# Patient Record
Sex: Female | Born: 1955 | Race: White | Hispanic: No | Smoking: Former smoker
Health system: Southern US, Community
[De-identification: ages and names within clinical notes are randomized; demographics above are authoritative.]

## PROBLEM LIST (undated history)

## (undated) DIAGNOSIS — B009 Herpesviral infection, unspecified: Secondary | ICD-10-CM

## (undated) DIAGNOSIS — E049 Nontoxic goiter, unspecified: Secondary | ICD-10-CM

## (undated) DIAGNOSIS — R112 Nausea with vomiting, unspecified: Secondary | ICD-10-CM

## (undated) DIAGNOSIS — K219 Gastro-esophageal reflux disease without esophagitis: Secondary | ICD-10-CM

## (undated) DIAGNOSIS — M069 Rheumatoid arthritis, unspecified: Secondary | ICD-10-CM

## (undated) DIAGNOSIS — E039 Hypothyroidism, unspecified: Secondary | ICD-10-CM

## (undated) DIAGNOSIS — M1990 Unspecified osteoarthritis, unspecified site: Secondary | ICD-10-CM

## (undated) DIAGNOSIS — N8501 Benign endometrial hyperplasia: Secondary | ICD-10-CM

## (undated) DIAGNOSIS — T8859XA Other complications of anesthesia, initial encounter: Secondary | ICD-10-CM

## (undated) DIAGNOSIS — M810 Age-related osteoporosis without current pathological fracture: Secondary | ICD-10-CM

## (undated) DIAGNOSIS — Z9889 Other specified postprocedural states: Secondary | ICD-10-CM

## (undated) HISTORY — PX: WISDOM TOOTH EXTRACTION: SHX21

## (undated) HISTORY — DX: Nontoxic goiter, unspecified: E04.9

## (undated) HISTORY — DX: Age-related osteoporosis without current pathological fracture: M81.0

## (undated) HISTORY — DX: Herpesviral infection, unspecified: B00.9

## (undated) HISTORY — DX: Rheumatoid arthritis, unspecified: M06.9

## (undated) HISTORY — PX: BREAST EXCISIONAL BIOPSY: SUR124

## (undated) HISTORY — DX: Benign endometrial hyperplasia: N85.01

## (undated) HISTORY — PX: DILATION AND CURETTAGE OF UTERUS: SHX78

## (undated) HISTORY — DX: Gastro-esophageal reflux disease without esophagitis: K21.9

---
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
## 1992-11-01 HISTORY — PX: TMJ ARTHROPLASTY: SHX1066

## 2001-09-01 HISTORY — PX: HYSTEROSCOPY: SHX211

## 2004-09-23 ENCOUNTER — Other Ambulatory Visit: Admission: RE | Admit: 2004-09-23 | Discharge: 2004-09-23 | Payer: Self-pay | Admitting: Gynecology

## 2004-11-01 HISTORY — PX: THYROIDECTOMY: SHX17

## 2004-11-16 ENCOUNTER — Ambulatory Visit (HOSPITAL_COMMUNITY): Admission: RE | Admit: 2004-11-16 | Discharge: 2004-11-16 | Payer: Self-pay | Admitting: Endocrinology

## 2005-01-05 ENCOUNTER — Observation Stay (HOSPITAL_COMMUNITY): Admission: RE | Admit: 2005-01-05 | Discharge: 2005-01-06 | Payer: Self-pay | Admitting: Surgery

## 2005-01-05 ENCOUNTER — Encounter (INDEPENDENT_AMBULATORY_CARE_PROVIDER_SITE_OTHER): Payer: Self-pay | Admitting: Specialist

## 2005-09-20 ENCOUNTER — Other Ambulatory Visit: Admission: RE | Admit: 2005-09-20 | Discharge: 2005-09-20 | Payer: Self-pay | Admitting: Gynecology

## 2006-09-26 ENCOUNTER — Other Ambulatory Visit: Admission: RE | Admit: 2006-09-26 | Discharge: 2006-09-26 | Payer: Self-pay | Admitting: Gynecology

## 2006-09-27 ENCOUNTER — Ambulatory Visit (HOSPITAL_COMMUNITY): Admission: RE | Admit: 2006-09-27 | Discharge: 2006-09-27 | Payer: Self-pay | Admitting: Gynecology

## 2006-10-14 ENCOUNTER — Encounter: Admission: RE | Admit: 2006-10-14 | Discharge: 2006-10-14 | Payer: Self-pay | Admitting: Gynecology

## 2007-04-05 ENCOUNTER — Encounter: Admission: RE | Admit: 2007-04-05 | Discharge: 2007-04-05 | Payer: Self-pay | Admitting: Obstetrics and Gynecology

## 2007-09-02 DIAGNOSIS — N8501 Benign endometrial hyperplasia: Secondary | ICD-10-CM

## 2007-09-02 HISTORY — DX: Benign endometrial hyperplasia: N85.01

## 2007-09-08 ENCOUNTER — Encounter: Payer: Self-pay | Admitting: Gynecology

## 2007-09-08 ENCOUNTER — Ambulatory Visit (HOSPITAL_BASED_OUTPATIENT_CLINIC_OR_DEPARTMENT_OTHER): Admission: RE | Admit: 2007-09-08 | Discharge: 2007-09-08 | Payer: Self-pay | Admitting: Gynecology

## 2007-11-13 ENCOUNTER — Other Ambulatory Visit: Admission: RE | Admit: 2007-11-13 | Discharge: 2007-11-13 | Payer: Self-pay | Admitting: Gynecology

## 2007-11-20 ENCOUNTER — Encounter: Admission: RE | Admit: 2007-11-20 | Discharge: 2007-11-20 | Payer: Self-pay | Admitting: Gynecology

## 2008-12-02 ENCOUNTER — Ambulatory Visit: Payer: Self-pay | Admitting: Gynecology

## 2008-12-02 ENCOUNTER — Encounter: Payer: Self-pay | Admitting: Gynecology

## 2008-12-02 ENCOUNTER — Other Ambulatory Visit: Admission: RE | Admit: 2008-12-02 | Discharge: 2008-12-02 | Payer: Self-pay | Admitting: Gynecology

## 2008-12-04 ENCOUNTER — Encounter: Admission: RE | Admit: 2008-12-04 | Discharge: 2008-12-04 | Payer: Self-pay | Admitting: Gynecology

## 2008-12-18 ENCOUNTER — Encounter: Payer: Self-pay | Admitting: Gynecology

## 2008-12-18 ENCOUNTER — Ambulatory Visit: Payer: Self-pay | Admitting: Gynecology

## 2009-12-03 ENCOUNTER — Ambulatory Visit: Payer: Self-pay | Admitting: Gynecology

## 2009-12-03 ENCOUNTER — Other Ambulatory Visit: Admission: RE | Admit: 2009-12-03 | Discharge: 2009-12-03 | Payer: Self-pay | Admitting: Gynecology

## 2009-12-12 ENCOUNTER — Encounter: Admission: RE | Admit: 2009-12-12 | Discharge: 2009-12-12 | Payer: Self-pay | Admitting: Gynecology

## 2011-01-05 ENCOUNTER — Other Ambulatory Visit: Payer: Self-pay | Admitting: Gynecology

## 2011-01-05 DIAGNOSIS — Z1231 Encounter for screening mammogram for malignant neoplasm of breast: Secondary | ICD-10-CM

## 2011-01-13 ENCOUNTER — Encounter (INDEPENDENT_AMBULATORY_CARE_PROVIDER_SITE_OTHER): Payer: 59 | Admitting: Gynecology

## 2011-01-13 ENCOUNTER — Ambulatory Visit
Admission: RE | Admit: 2011-01-13 | Discharge: 2011-01-13 | Disposition: A | Discharge status: Home / Self Care | Payer: 59 | Source: Ambulatory Visit | Attending: Gynecology | Admitting: Gynecology

## 2011-01-13 ENCOUNTER — Other Ambulatory Visit (HOSPITAL_COMMUNITY)
Admission: RE | Admit: 2011-01-13 | Discharge: 2011-01-13 | Disposition: A | Discharge status: Home / Self Care | Payer: 59 | Source: Ambulatory Visit | Attending: Gynecology | Admitting: Gynecology

## 2011-01-13 ENCOUNTER — Other Ambulatory Visit: Payer: Self-pay | Admitting: Gynecology

## 2011-01-13 DIAGNOSIS — Z1231 Encounter for screening mammogram for malignant neoplasm of breast: Secondary | ICD-10-CM

## 2011-01-13 DIAGNOSIS — Z01419 Encounter for gynecological examination (general) (routine) without abnormal findings: Secondary | ICD-10-CM

## 2011-01-13 DIAGNOSIS — E559 Vitamin D deficiency, unspecified: Secondary | ICD-10-CM

## 2011-01-13 DIAGNOSIS — Z124 Encounter for screening for malignant neoplasm of cervix: Secondary | ICD-10-CM | POA: Insufficient documentation

## 2011-03-16 NOTE — H&P (Signed)
Donna Padilla, Donna Padilla             ACCOUNT NO.:  [id number]   MEDICAL RECORD NO.:  [id number]          PATIENT TYPE:  AMB   LOCATION:  [facility]                         FACILITY:  [facility]   PHYSICIAN:  Timothy P. Fontaine, M.D.DATE OF BIRTH:  [birth year]   DATE OF ADMISSION:  DATE OF DISCHARGE:                              HISTORY & PHYSICAL   CHIEF COMPLAINT:  Irregular menses, menorrhagia, endometrial polyp.   HISTORY OF PRESENT ILLNESS:  A 55 year old G2, P2 female, vasectomy  birth control, initially presented with heavy periods and irregular  periods.  Ultrasound evaluation Sonohystogram revealed a large  endometrial polyp measuring 35 mm, and she is admitted at this time for  hysteroscopy D&C removal of the polyp.   PAST MEDICAL HISTORY:  Significant for hypothyroidism being followed by  Dr. Talmage Nap, currently on Synthroid.   PAST SURGICAL HISTORY:  1. Appendectomy.  2. Breast biopsy.  3. TMJ surgery.  4. Thyroidectomy.   CURRENT MEDICATIONS:  Synthroid.   ALLERGIES:  No medications.   REVIEW OF SYSTEMS:  Noncontributory.   FAMILY HISTORY:  Noncontributory.   SOCIAL HISTORY:  Noncontributory.   PHYSICAL EXAMINATION:  VITAL SIGNS:  Afebrile, vital signs stable.  HEENT: Normal.  LUNGS:  Clear.  CARDIAC:  Regular rate without rubs, murmurs or gallops.  ABDOMEN:  Benign.  PELVIC:  External BUS, vagina normal.  Cervix normal.  Uterus normal  size, midline mobile, nontender.  Adnexa without masses or tenderness.   ASSESSMENT:  A 55 year old G2 P2 female with heavy menses irregular  menses and ultrasound showing one, possibly two large endometrial  polyps.  The patient is admitted at this time for hysteroscopy, removal  of polyps, D&C.  I reviewed with the patient the proposed surgery,  expected intraoperative postoperative courses, instrumentation to  include use of hysteroscope, resectoscope and D&C.  The acute  intraoperative/postoperative risks include bleeding,  transfusion,  infection, uterine perforation either immediately recognized or delay  recognized,  damage to internal organs, bowel, bladder, ureters, vessels and nerves  necessitating major exploratory reparative surgeries, future reparative  surgeries, bowel resection, ostomy formation were all discussed,  understood and accepted.  The patient's questions were answered to her  satisfaction.  She is ready to proceed with surgery.      Timothy P. Fontaine, M.D.  Electronically Signed     TPF/MEDQ  D:  09/04/2007  T:  09/05/2007  Job:  [id number]

## 2011-03-16 NOTE — Op Note (Signed)
Donna Padilla, ORMAND             ACCOUNT NO.:  [id number]   MEDICAL RECORD NO.:  [id number]          PATIENT TYPE:  AMB   LOCATION:  [facility]                         FACILITY:  [facility]   PHYSICIAN:  Timothy P. Fontaine, M.D.DATE OF BIRTH:  08-18-1956   DATE OF PROCEDURE:  09/08/2007  DATE OF DISCHARGE:                               OPERATIVE REPORT   PREOPERATIVE DIAGNOSES:  Endometrial polyp, menorrhagia.   POSTOPERATIVE DIAGNOSES:  Endometrial polyp, menorrhagia.   PROCEDURE:  Hysteroscopic resection endometrial polyp, D&C.   SURGEON:  Dr. Colin Broach.   ANESTHETIC:  General with 1% lidocaine paracervical block.   SPECIMEN:  1. Endometrial polyps.  2. Endometrial curetting.   COMPLICATIONS:  None.   ESTIMATED BLOOD LOSS:  Minimal.   SORBITOL DISCREPANCY:  60 mL.   FINDINGS:  EUA external BUS and vagina normal.  Cervix normal, uterus  normal size, midline and mobile.  Adnexa without masses.  Hysteroscopic  large right fundal endometrial polyp from fundus down to the internal os  to smaller left fundal polyps post polypectomy.  Hysteroscopic  evaluation was adequate and normal.   DESCRIPTION OF PROCEDURE:  The patient was taken to the operating room,  underwent general anesthesia and was placed in the dorsal lithotomy  position, received a vaginal perineal preparation with Betadine  solution. Bladder emptied with in-and-out Foley catheterization.  EUA  performed and the patient was draped in the usual fashion.  The cervix  was visualized with a speculum, a single-tooth tenaculum applied to the  anterior lip. A 1% lidocaine paracervical block was placed, a total of  10 mL.  The cervix was gently and gradually dilated to admit the  operative hysteroscope.  Hysteroscopy was performed with findings noted  above.  The large right fundal polyp was transected at its base with a  right-angle resectoscopic loop, removed and sent to pathology intact.  The two smaller polyps were  similarly excised at their base with the  right-angle loop and sent to pathology.  A sharp curettage was  performed, sent to pathology as a separate specimen. Rehysteroscopy  showed an adequate normal-appearing cavity with good  distention, no evidence of perforation.  The instruments were all  removed.  Hemostasis visualized.  The speculum removed. The patient  placed in supine position, awakened without difficulty and taken to  recovery room in good condition having tolerated the procedure well.      Timothy P. Fontaine, M.D.  Electronically Signed     TPF/MEDQ  D:  09/08/2007  T:  09/09/2007  Job:  [id number]

## 2011-03-19 NOTE — Op Note (Signed)
[name], Padilla             ACCOUNT NO.:  [id number]   MEDICAL RECORD NO.:  [id number]          PATIENT TYPE:  AMB   LOCATION:  [facility]                          FACILITY:  [facility]   PHYSICIAN:  Velora Heckler, MD      DATE OF BIRTH:  Jun 15, 1956   DATE OF PROCEDURE:  01/05/2005  DATE OF DISCHARGE:                                 OPERATIVE REPORT   PREOPERATIVE DIAGNOSIS:  Thyroid goiter with compressive symptoms.   POSTOPERATIVE DIAGNOSES:  Thyroid goiter with compressive symptoms.   PROCEDURE:  Total thyroidectomy.   SURGEON:  Velora Heckler, M.D.   ASSISTANT:  Rose Phi. Maple Hudson, M.D.   ANESTHESIA:  General.   ESTIMATED BLOOD LOSS:  75 mL.   PREPARATION:  Betadine.   COMPLICATIONS:  None.   INDICATIONS:  The patient is a 55 year old white female from [place], [place]  who presents with thyroid goiter. This has been present for five years and  gradually increasing in size. The patient has been on thyroid hormone  suppression without success. She was referred to Dr. Dorisann Frames at [facility]  [facility]. A thyroid ultrasound showed a multinodular goiter with  dominant nodules in both lobes. The patient has developed progressive  compressive symptoms including hoarseness and solid food dysphagia. The  patient now comes to surgery for thyroidectomy.   DESCRIPTION OF PROCEDURE:  The procedure is done in OR #4 at the [facility]. The patient is brought to the operating room, placed in  a supine position on the operating room table. Following the administration  of general anesthesia,  the patient is prepped and draped in usual strict  aseptic fashion. After ascertaining that an adequate level of anesthesia had  been obtained, a Kocher incision was made with a  #15 blade. Dissection was  carried down through subcutaneous tissues and platysma. Hemostasis was  obtained with the electrocautery. Skin flaps were developed cephalad and  caudad from the thyroid  notch to the sternal notch. A Mahorner self-  retaining retractor was placed for exposure. The goiter is quite large. It  was relatively symmetric. Dissection was begun on the left side. Strap  muscles were reflected laterally. Left thyroid lobe was mobilized. Middle  thyroid vein was divided between medium ligaclips.  The superior pole was  gently dissected out. The superior pole vessels were ligated in continuity  with 2-0 silk ties and medium ligaclips and divided. The gland is rolled  further anteriorly. The inferior venous tributaries were divided between  medium ligaclips and also ligated with 2-0 silk ties. The gland is rolled  further to the midline. Branches of the inferior thyroid artery were  dissected out and divided between small and medium ligaclips. Recurrent  laryngeal nerve was identified and preserved. Parathyroid tissue was  identified and preserved. Dissection was carried up to the trachea. A small  pyramidal lobe was also excised with the electrocautery from the midline.  Next we turned our attention to the right lobe. Again strap muscles were  reflected laterally. Middle thyroid vein was divided between medium  ligaclips. The superior pole was  dissected out. The superior pole vessels  were ligated in continuity with 2-0 silk ties and medium ligaclips and  divided. The gland is rolled medially. Inferior venous tributaries were  divided between medium ligaclips. The recurrent nerve was identified and  preserved. Parathyroid tissue was identified and preserved. Branches of the  inferior thyroid artery adjacent to the trachea were divided between medium  hemoclips. The gland was rolled up and onto the anterior trachea from which  it was excised with the electrocautery. Two remaining inferior venous  tributaries were ligated with 2-0 silk ties and divided. The gland was  marked with a suture on the left lobe. It is submitted in its entirety to  pathology for review. Neck  was irrigated with warm saline. Good hemostasis  was achieved. Surgicel  was placed over the area of the recurrent nerves  bilaterally. Strap muscles were closed with interrupted 3-0 Vicryl sutures.  Platysma was closed with interrupted 3-0 Vicryl sutures. The skin was closed  with a running 4-0 Vicryl subcuticular suture. Wounds is washed and dried  and Benzoin and Steri-Strips were applied. Sterile dressings were applied.  The patient was awakened from anesthesia and brought to the recovery room in  stable condition. The patient tolerated the procedure well.      TMG/MEDQ  D:  01/05/2005  T:  01/05/2005  Job:  [id number]   cc:   Dorisann Frames, M.D.  [place] [address], [place] [address]  Fax: [phone]

## 2011-12-14 ENCOUNTER — Other Ambulatory Visit: Payer: Self-pay | Admitting: Gynecology

## 2011-12-14 DIAGNOSIS — Z1231 Encounter for screening mammogram for malignant neoplasm of breast: Secondary | ICD-10-CM

## 2012-01-05 DIAGNOSIS — B009 Herpesviral infection, unspecified: Secondary | ICD-10-CM | POA: Insufficient documentation

## 2012-01-05 DIAGNOSIS — N8501 Benign endometrial hyperplasia: Secondary | ICD-10-CM | POA: Insufficient documentation

## 2012-01-05 DIAGNOSIS — E049 Nontoxic goiter, unspecified: Secondary | ICD-10-CM | POA: Insufficient documentation

## 2012-01-18 ENCOUNTER — Encounter: Payer: Self-pay | Admitting: Gynecology

## 2012-01-18 ENCOUNTER — Ambulatory Visit (INDEPENDENT_AMBULATORY_CARE_PROVIDER_SITE_OTHER): Payer: 59 | Admitting: Gynecology

## 2012-01-18 VITALS — BP 128/78 | Ht 69.5 in | Wt 246.0 lb

## 2012-01-18 DIAGNOSIS — Z01419 Encounter for gynecological examination (general) (routine) without abnormal findings: Secondary | ICD-10-CM

## 2012-01-18 NOTE — Patient Instructions (Signed)
Follow up in one year for her annual gynecologic exam. 

## 2012-01-18 NOTE — Progress Notes (Signed)
Donna Padilla 1956-06-03 [id number]        56 y.o.  for annual exam.  Doing well without complaints.  Past medical history,surgical history, medications, allergies, family history and social history were all reviewed and documented in the EPIC chart. ROS:  Was performed and pertinent positives and negatives are included in the history.  Exam: Sherrilyn Rist chaperone present Filed Vitals:   01/18/12 1210  BP: 128/78   General appearance  Normal Skin grossly normal Head/Neck normal with no cervical or supraclavicular adenopathy thyroid normal Lungs  clear Cardiac RR, without RMG Abdominal  soft, nontender, without masses, organomegaly or hernia Breasts  examined lying and sitting without masses, retractions, discharge or axillary adenopathy. Pelvic  Ext/BUS/vagina  normal mild atrophic changes  Cervix  normal    Uterus  axial, normal size, shape and contour, midline and mobile nontender   Adnexa  Without masses or tenderness    Anus and perineum  normal   Rectovaginal  normal sphincter tone without palpated masses or tenderness.    Assessment/Plan:  56 y.o. female for annual exam.    1. Postmenopausal doing well, no bleeding or significant symptoms. 2. Pap smear. I did not do a Pap smear today. Her last Pap smear was 2012.  No history of abnormal Pap smears before with multiple normal reports in her chart.  I discussed current screening guidelines and she is comfortable with less frequent interval and we'll plan on every three-year Pap smears. 3. Mammography. Patient has her mammogram scheduled in 2 days. She'll continue with annual mammography. SBE monthly reviewed. 4. Colonoscopy. Last colonoscopy 2008. Due to be repeated at a 10 year interval. I gave her an OC light kit and she knows to mail this back, call make sure we received it and that it is negative. 5. Bone health. Patient had her DEXA done at Dr. Janus Molder office and it was normal 2 years ago.  Increased calcium vitamin D  reviewed. 6. Health maintenance. Her blood work was done today as it's all done through Dr. Willeen Cass office who follows her for her medical issues.    Dara Lords MD, 12:33 PM 01/18/2012

## 2012-01-20 ENCOUNTER — Ambulatory Visit
Admission: RE | Admit: 2012-01-20 | Discharge: 2012-01-20 | Disposition: A | Discharge status: Home / Self Care | Payer: 59 | Source: Ambulatory Visit | Attending: Gynecology | Admitting: Gynecology

## 2012-01-20 DIAGNOSIS — Z1231 Encounter for screening mammogram for malignant neoplasm of breast: Secondary | ICD-10-CM

## 2013-02-15 ENCOUNTER — Other Ambulatory Visit: Payer: Self-pay

## 2013-02-15 ENCOUNTER — Other Ambulatory Visit: Payer: Self-pay | Admitting: Gynecology

## 2013-02-15 DIAGNOSIS — Z1231 Encounter for screening mammogram for malignant neoplasm of breast: Secondary | ICD-10-CM

## 2013-03-02 ENCOUNTER — Encounter: Payer: Self-pay | Admitting: Gynecology

## 2013-03-14 ENCOUNTER — Encounter: Payer: Self-pay | Admitting: Gynecology

## 2013-03-14 ENCOUNTER — Ambulatory Visit (INDEPENDENT_AMBULATORY_CARE_PROVIDER_SITE_OTHER): Payer: 59 | Admitting: Gynecology

## 2013-03-14 VITALS — BP 120/78 | Ht 69.0 in | Wt 232.0 lb

## 2013-03-14 DIAGNOSIS — Z01419 Encounter for gynecological examination (general) (routine) without abnormal findings: Secondary | ICD-10-CM

## 2013-03-14 DIAGNOSIS — R21 Rash and other nonspecific skin eruption: Secondary | ICD-10-CM

## 2013-03-14 MED ORDER — NYSTATIN-TRIAMCINOLONE 100000-0.1 UNIT/GM-% EX OINT: TOPICAL_OINTMENT | Freq: Two times a day (BID) | CUTANEOUS | Status: DC

## 2013-03-14 NOTE — Progress Notes (Signed)
Donna Padilla [birth year] [id number]        57 y.o.  G2P2002 for annual exam.  Doing well without complaints.  Past medical history,surgical history, medications, allergies, family history and social history were all reviewed and documented in the EPIC chart. ROS:  Was performed and pertinent positives and negatives are included in the history.  Exam: Kim assistant Filed Vitals:   03/14/13 1447  BP: 120/78  Height: 5\' 9"  (1.753 m)  Weight: 232 lb (105.235 kg)   General appearance  Normal Skin grossly normal Head/Neck normal with no cervical or supraclavicular adenopathy thyroid normal Lungs  clear Cardiac RR, without RMG Abdominal  soft, nontender, without masses, organomegaly or hernia Breasts  examined lying and sitting without masses, retractions, discharge or axillary adenopathy. Pelvic  Ext/BUS/vagina  normal   Cervix  normal   Uterus  anteverted, normal size, shape and contour, midline and mobile nontender   Adnexa  Without masses or tenderness    Anus and perineum  normal   Rectovaginal  normal sphincter tone without palpated masses or tenderness.    Assessment/Plan:  [name] female for annual exam.   1. Postmenopausal. Doing well without significant complaints of hot flashes sweats vaginal dryness or irritation. We'll continue to monitor. 2. Urinary frequency. Patient noted some urinary frequency after drinking carbonated beverages. We'll go ahead and check a urinalysis just to make sure she does not have a low-grade UTI. Otherwise recommend avoid carbonated beverages. 3. Mammography scheduled next week. Continue with annual mammography. SBE monthly reviewed. 4. Pap smear 2012. No Pap smear done today. No history of abnormal Pap smears previously. Plan repeat next year at three-year interval. 5. DEXA 2012 through primary physician's office reportedly normal. We'll continue to followup with them in reference to this. Increase calcium vitamin D  reviewed. 6. Colonoscopy 8 years ago with planned repeat at 10 year interval. 7. Health maintenance. No lab work done as it is all done through her primary physician's office. Followup one year, sooner as needed.    Dara Lords MD, 3:25 PM 03/14/2013

## 2013-03-14 NOTE — Patient Instructions (Signed)
Follow-up in 1 year for annual exam, sooner if any issues. 

## 2013-03-15 LAB — URINALYSIS W MICROSCOPIC + REFLEX CULTURE
Bacteria, UA: NONE SEEN
Bilirubin Urine: NEGATIVE
Casts: NONE SEEN
Crystals: NONE SEEN
Glucose, UA: NEGATIVE mg/dL
Hgb urine dipstick: NEGATIVE
Ketones, ur: NEGATIVE mg/dL
Leukocytes, UA: NEGATIVE
Nitrite: NEGATIVE
Protein, ur: NEGATIVE mg/dL
Specific Gravity, Urine: 1.012 (ref 1.005–1.030)
Squamous Epithelial / LPF: NONE SEEN
Urobilinogen, UA: 0.2 mg/dL (ref 0.0–1.0)
pH: 5.5 (ref 5.0–8.0)

## 2013-03-19 ENCOUNTER — Ambulatory Visit
Admission: RE | Admit: 2013-03-19 | Discharge: 2013-03-19 | Disposition: A | Discharge status: Home / Self Care | Payer: 59 | Source: Ambulatory Visit

## 2013-03-19 DIAGNOSIS — Z1231 Encounter for screening mammogram for malignant neoplasm of breast: Secondary | ICD-10-CM

## 2014-03-08 ENCOUNTER — Other Ambulatory Visit: Payer: Self-pay

## 2014-03-08 DIAGNOSIS — Z1231 Encounter for screening mammogram for malignant neoplasm of breast: Secondary | ICD-10-CM

## 2014-03-15 ENCOUNTER — Encounter: Payer: 59 | Admitting: Gynecology

## 2014-03-26 ENCOUNTER — Ambulatory Visit
Admission: RE | Admit: 2014-03-26 | Discharge: 2014-03-26 | Disposition: A | Discharge status: Home / Self Care | Payer: 59 | Source: Ambulatory Visit

## 2014-03-26 DIAGNOSIS — Z1231 Encounter for screening mammogram for malignant neoplasm of breast: Secondary | ICD-10-CM

## 2014-04-25 ENCOUNTER — Other Ambulatory Visit (HOSPITAL_COMMUNITY)
Admission: RE | Admit: 2014-04-25 | Discharge: 2014-04-25 | Disposition: A | Discharge status: Home / Self Care | Payer: 59 | Source: Ambulatory Visit | Attending: Gynecology | Admitting: Gynecology

## 2014-04-25 ENCOUNTER — Encounter: Payer: Self-pay | Admitting: Gynecology

## 2014-04-25 ENCOUNTER — Ambulatory Visit (INDEPENDENT_AMBULATORY_CARE_PROVIDER_SITE_OTHER): Payer: 59 | Admitting: Gynecology

## 2014-04-25 VITALS — BP 134/80 | Ht 69.5 in | Wt 232.0 lb

## 2014-04-25 DIAGNOSIS — B009 Herpesviral infection, unspecified: Secondary | ICD-10-CM

## 2014-04-25 DIAGNOSIS — Z01419 Encounter for gynecological examination (general) (routine) without abnormal findings: Secondary | ICD-10-CM

## 2014-04-25 DIAGNOSIS — E559 Vitamin D deficiency, unspecified: Secondary | ICD-10-CM

## 2014-04-25 DIAGNOSIS — Z1151 Encounter for screening for human papillomavirus (HPV): Secondary | ICD-10-CM | POA: Insufficient documentation

## 2014-04-25 DIAGNOSIS — B001 Herpesviral vesicular dermatitis: Secondary | ICD-10-CM

## 2014-04-25 MED ORDER — VALACYCLOVIR HCL 500 MG PO TABS: 500.0000 mg | ORAL_TABLET | Freq: Two times a day (BID) | ORAL | Status: DC

## 2014-04-25 MED ORDER — VITAMIN D (ERGOCALCIFEROL) 1.25 MG (50000 UNIT) PO CAPS: 50000.0000 [IU] | ORAL_CAPSULE | ORAL | Status: DC

## 2014-04-25 NOTE — Patient Instructions (Signed)
Take the vitamin D weekly pill for 3 months. Followup for a repeat vitamin D level then. Check with Dr. Almetta Lovely office and if she did not have a lipid profile call the office year and we will do a fasting lipid profile for you.  You may obtain a copy of any labs that were done today by logging onto MyChart as outlined in the instructions provided with your AVS (after visit summary). The office will not call with normal lab results but certainly if there are any significant abnormalities then we will contact you.   Health Maintenance, Female A healthy lifestyle and preventative care can promote health and wellness.  Maintain regular health, dental, and eye exams.  Eat a healthy diet. Foods like vegetables, fruits, whole grains, low-fat dairy products, and lean protein foods contain the nutrients you need without too many calories. Decrease your intake of foods high in solid fats, added sugars, and salt. Get information about a proper diet from your caregiver, if necessary.  Regular physical exercise is one of the most important things you can do for your health. Most adults should get at least 150 minutes of moderate-intensity exercise (any activity that increases your heart rate and causes you to sweat) each week. In addition, most adults need muscle-strengthening exercises on 2 or more days a week.   Maintain a healthy weight. The body mass index (BMI) is a screening tool to identify possible weight problems. It provides an estimate of body fat based on height and weight. Your caregiver can help determine your BMI, and can help you achieve or maintain a healthy weight. For adults 20 years and older:  A BMI below 18.5 is considered underweight.  A BMI of 18.5 to 24.9 is normal.  A BMI of 25 to 29.9 is considered overweight.  A BMI of 30 and above is considered obese.  Maintain normal blood lipids and cholesterol by exercising and minimizing your intake of saturated fat. Eat a balanced diet  with plenty of fruits and vegetables. Blood tests for lipids and cholesterol should begin at age 52 and be repeated every 5 years. If your lipid or cholesterol levels are high, you are over 50, or you are a high risk for heart disease, you may need your cholesterol levels checked more frequently.Ongoing high lipid and cholesterol levels should be treated with medicines if diet and exercise are not effective.  If you smoke, find out from your caregiver how to quit. If you do not use tobacco, do not start.  Lung cancer screening is recommended for adults aged 23 80 years who are at high risk for developing lung cancer because of a history of smoking. Yearly low-dose computed tomography (CT) is recommended for people who have at least a 30-pack-year history of smoking and are a current smoker or have quit within the past 15 years. A pack year of smoking is smoking an average of 1 pack of cigarettes a day for 1 year (for example: 1 pack a day for 30 years or 2 packs a day for 15 years). Yearly screening should continue until the smoker has stopped smoking for at least 15 years. Yearly screening should also be stopped for people who develop a health problem that would prevent them from having lung cancer treatment.  If you are pregnant, do not drink alcohol. If you are breastfeeding, be very cautious about drinking alcohol. If you are not pregnant and choose to drink alcohol, do not exceed 1 drink per day. One drink  is considered to be 12 ounces (355 mL) of beer, 5 ounces (148 mL) of wine, or 1.5 ounces (44 mL) of liquor.  Avoid use of street drugs. Do not share needles with anyone. Ask for help if you need support or instructions about stopping the use of drugs.  High blood pressure causes heart disease and increases the risk of stroke. Blood pressure should be checked at least every 1 to 2 years. Ongoing high blood pressure should be treated with medicines, if weight loss and exercise are not  effective.  If you are 64 to 59 years old, ask your caregiver if you should take aspirin to prevent strokes.  Diabetes screening involves taking a blood sample to check your fasting blood sugar level. This should be done once every 3 years, after age 37, if you are within normal weight and without risk factors for diabetes. Testing should be considered at a younger age or be carried out more frequently if you are overweight and have at least 1 risk factor for diabetes.  Breast cancer screening is essential preventative care for women. You should practice "breast self-awareness." This means understanding the normal appearance and feel of your breasts and may include breast self-examination. Any changes detected, no matter how small, should be reported to a caregiver. Women in their 85s and 30s should have a clinical breast exam (CBE) by a caregiver as part of a regular health exam every 1 to 3 years. After age 63, women should have a CBE every year. Starting at age 55, women should consider having a mammogram (breast X-ray) every year. Women who have a family history of breast cancer should talk to their caregiver about genetic screening. Women at a high risk of breast cancer should talk to their caregiver about having an MRI and a mammogram every year.  Breast cancer gene (BRCA)-related cancer risk assessment is recommended for women who have family members with BRCA-related cancers. BRCA-related cancers include breast, ovarian, tubal, and peritoneal cancers. Having family members with these cancers may be associated with an increased risk for harmful changes (mutations) in the breast cancer genes BRCA1 and BRCA2. Results of the assessment will determine the need for genetic counseling and BRCA1 and BRCA2 testing.  The Pap test is a screening test for cervical cancer. Women should have a Pap test starting at age 74. Between ages 81 and 49, Pap tests should be repeated every 2 years. Beginning at age 26,  you should have a Pap test every 3 years as long as the past 3 Pap tests have been normal. If you had a hysterectomy for a problem that was not cancer or a condition that could lead to cancer, then you no longer need Pap tests. If you are between ages 63 and 79, and you have had normal Pap tests going back 10 years, you no longer need Pap tests. If you have had past treatment for cervical cancer or a condition that could lead to cancer, you need Pap tests and screening for cancer for at least 20 years after your treatment. If Pap tests have been discontinued, risk factors (such as a new sexual partner) need to be reassessed to determine if screening should be resumed. Some women have medical problems that increase the chance of getting cervical cancer. In these cases, your caregiver may recommend more frequent screening and Pap tests.  The human papillomavirus (HPV) test is an additional test that may be used for cervical cancer screening. The HPV test looks for  the virus that can cause the cell changes on the cervix. The cells collected during the Pap test can be tested for HPV. The HPV test could be used to screen women aged 7 years and older, and should be used in women of any age who have unclear Pap test results. After the age of 40, women should have HPV testing at the same frequency as a Pap test.  Colorectal cancer can be detected and often prevented. Most routine colorectal cancer screening begins at the age of 69 and continues through age 75. However, your caregiver may recommend screening at an earlier age if you have risk factors for colon cancer. On a yearly basis, your caregiver may provide home test kits to check for hidden blood in the stool. Use of a small camera at the end of a tube, to directly examine the colon (sigmoidoscopy or colonoscopy), can detect the earliest forms of colorectal cancer. Talk to your caregiver about this at age 42, when routine screening begins. Direct examination of  the colon should be repeated every 5 to 10 years through age 35, unless early forms of pre-cancerous polyps or small growths are found.  Hepatitis C blood testing is recommended for all people born from [birth year] through 1966 and any individual with known risks for hepatitis C.  Practice safe sex. Use condoms and avoid high-risk sexual practices to reduce the spread of sexually transmitted infections (STIs). Sexually active women aged 63 and younger should be checked for Chlamydia, which is a common sexually transmitted infection. Older women with new or multiple partners should also be tested for Chlamydia. Testing for other STIs is recommended if you are sexually active and at increased risk.  Osteoporosis is a disease in which the bones lose minerals and strength with aging. This can result in serious bone fractures. The risk of osteoporosis can be identified using a bone density scan. Women ages 69 and over and women at risk for fractures or osteoporosis should discuss screening with their caregivers. Ask your caregiver whether you should be taking a calcium supplement or vitamin D to reduce the rate of osteoporosis.  Menopause can be associated with physical symptoms and risks. Hormone replacement therapy is available to decrease symptoms and risks. You should talk to your caregiver about whether hormone replacement therapy is right for you.  Use sunscreen. Apply sunscreen liberally and repeatedly throughout the day. You should seek shade when your shadow is shorter than you. Protect yourself by wearing long sleeves, pants, a wide-brimmed hat, and sunglasses year round, whenever you are outdoors.  Notify your caregiver of new moles or changes in moles, especially if there is a change in shape or color. Also notify your caregiver if a mole is larger than the size of a pencil eraser.  Stay current with your immunizations. Document Released: 05/03/2011 Document Revised: 02/12/2013 Document Reviewed:  05/03/2011 [facility] Patient Information 2014 [place].

## 2014-04-25 NOTE — Addendum Note (Signed)
Addended by: Dayna BarkerGARDNER, KIMBERLY K on: 04/25/2014 03:59 PM   Modules accepted: Orders

## 2014-04-25 NOTE — Progress Notes (Signed)
Donna FudgeDonna Padilla [birth year] [id number]        58 y.o.  G2P2002 for annual exam.  Several issues below.  Past medical history,surgical history, problem list, medications, allergies, family history and social history were all reviewed and documented as reviewed in the EPIC chart.  ROS:  12 system ROS performed with pertinent positives and negatives included in the history, assessment and plan.   Additional significant findings :  None   Exam: [facility] person[facility] Filed Vitals:   04/25/14 1520  BP: 134/80  Height: 5' 9.5" (1.765 m)  Weight: 232 lb (105.235 kg)   General appearance:  Normal affect, orientation and appearance. Skin: Grossly normal HEENT: Without gross lesions.  No cervical or supraclavicular adenopathy. Thyroid normal.  Lungs:  Clear without wheezing, rales or rhonchi Cardiac: RR, without RMG Abdominal:  Soft, nontender, without masses, guarding, rebound, organomegaly or hernia Breasts:  Examined lying and sitting without masses, retractions, discharge or axillary adenopathy. Pelvic:  Ext/BUS/vagina with mild atrophic changes  Cervix normal. Pap/HPV  Uterus anteverted, normal size, shape and contour, midline and mobile nontender   Adnexa  Without masses or tenderness    Anus and perineum  Normal   Rectovaginal  Normal sphincter tone without palpated masses or tenderness.    Assessment/Plan:  58 y.o. 732P2002 female for annual exam.   1. Postmenopausal mild vaginal atrophy. Patient doing well without significant hot flushes, night sweats, vaginal dryness or dyspareunia. No vaginal bleeding. Will continue to monitor. Report any vaginal bleeding. 2. Pap smear 2012. Pap/HPV today. No history of significant abnormal Pap smears. Plan repeat at 3-5 year interval assuming normal per current screening guidelines. 3. Mammography 03/2014. Continue with annual mammography. SBE monthly reviewed. 4. DEXA reported normal 5 years ago. Plan repeat DEXA age 58. Did have a lower vitamin D level  at Dr. Eliot FordAllen's office and started on 2000 units daily. She asked if she could do the 50,000 unit weekly. #16 one pill every week provided with instructions to return for vitamin D level after 3 months. 5. Herpes labialis. Occasional cold sore outbreaks. Uses Valtrex 500 mg twice a day x5 days with good results. #20 with 3 refills provided. 6. Colonoscopy 9 years ago with planned repeat this coming year. Patient knows to schedule. 7. Health maintenance.  Patient reports having her routine blood work done at Dr. Willeen CassBalan's office. She is not clear if a lipid profile was done I asked her to call Dr. Willeen CassBalan's office to make sure and if not she'll call back here and we'll do a fasting lipid profile. She does remember having other blood work including thyroid glucose vitamin D drawn. Followup for repeat vitamin D level otherwise annual followup.   Note: This document was prepared with digital dictation and possible smart phrase technology. Any transcriptional errors that result from this process are unintentional.   Dara LordsFONTAINE,TIMOTHY P MD, 3:52 PM 04/25/2014

## 2014-04-26 LAB — URINALYSIS W MICROSCOPIC + REFLEX CULTURE
Bacteria, UA: NONE SEEN
Bilirubin Urine: NEGATIVE
Casts: NONE SEEN
Crystals: NONE SEEN
Glucose, UA: NEGATIVE mg/dL
Hgb urine dipstick: NEGATIVE
Ketones, ur: NEGATIVE mg/dL
Leukocytes, UA: NEGATIVE
Nitrite: NEGATIVE
Protein, ur: NEGATIVE mg/dL
Specific Gravity, Urine: 1.022 (ref 1.005–1.030)
Squamous Epithelial / LPF: NONE SEEN
Urobilinogen, UA: 1 mg/dL (ref 0.0–1.0)
pH: 5.5 (ref 5.0–8.0)

## 2014-04-30 LAB — CYTOLOGY - PAP

## 2014-07-23 ENCOUNTER — Other Ambulatory Visit: Payer: Self-pay | Admitting: Gynecology

## 2014-08-14 ENCOUNTER — Other Ambulatory Visit: Payer: 59

## 2014-08-14 DIAGNOSIS — E559 Vitamin D deficiency, unspecified: Secondary | ICD-10-CM

## 2014-08-15 LAB — VITAMIN D 25 HYDROXY (VIT D DEFICIENCY, FRACTURES): Vit D, 25-Hydroxy: 59 ng/mL (ref 30–89)

## 2014-09-02 ENCOUNTER — Encounter: Payer: Self-pay | Admitting: Gynecology

## 2015-03-13 ENCOUNTER — Other Ambulatory Visit: Payer: Self-pay

## 2015-03-13 DIAGNOSIS — Z1231 Encounter for screening mammogram for malignant neoplasm of breast: Secondary | ICD-10-CM

## 2015-04-07 ENCOUNTER — Ambulatory Visit
Admission: RE | Admit: 2015-04-07 | Discharge: 2015-04-07 | Disposition: A | Discharge status: Home / Self Care | Payer: 59 | Source: Ambulatory Visit

## 2015-04-07 ENCOUNTER — Encounter (INDEPENDENT_AMBULATORY_CARE_PROVIDER_SITE_OTHER): Payer: Self-pay

## 2015-04-07 DIAGNOSIS — Z1231 Encounter for screening mammogram for malignant neoplasm of breast: Secondary | ICD-10-CM

## 2015-04-28 ENCOUNTER — Ambulatory Visit (INDEPENDENT_AMBULATORY_CARE_PROVIDER_SITE_OTHER): Payer: 59 | Admitting: Gynecology

## 2015-04-28 ENCOUNTER — Encounter: Payer: Self-pay | Admitting: Gynecology

## 2015-04-28 VITALS — BP 122/76 | Ht 69.0 in | Wt 193.0 lb

## 2015-04-28 DIAGNOSIS — Z01419 Encounter for gynecological examination (general) (routine) without abnormal findings: Secondary | ICD-10-CM

## 2015-04-28 DIAGNOSIS — N952 Postmenopausal atrophic vaginitis: Secondary | ICD-10-CM

## 2015-04-28 NOTE — Progress Notes (Signed)
Donna Padilla Nov 16, 1955 [id number]        59 y.o.  G2P2002 for annual exam.  Doing well without complaints  Past medical history,surgical history, problem list, medications, allergies, family history and social history were all reviewed and documented as reviewed in the EPIC chart.  ROS:  Performed with pertinent positives and negatives included in the history, assessment and plan.   Additional significant findings :  none   Exam: [facility] personass[facility] Filed Vitals:   04/28/15 1150  BP: 122/76  Height: 5\' 9"  (1.753 m)  Weight: 193 lb (87.544 kg)   General appearance:  Normal affect, orientation and appearance. Skin: Grossly normal HEENT: Without gross lesions.  No cervical or supraclavicular adenopathy. Thyroid normal.  Lungs:  Clear without wheezing, rales or rhonchi Cardiac: RR, without RMG Abdominal:  Soft, nontender, without masses, guarding, rebound, organomegaly or hernia Breasts:  Examined lying and sitting without masses, retractions, discharge or axillary adenopathy. Pelvic:  Ext/BUS/vagina with atrophic changes  Cervix with atrophic changes  Uterus anteverted, normal size, shape and contour, midline and mobile nontender   Adnexa  Without masses or tenderness    Anus and perineum  Normal   Rectovaginal  Normal sphincter tone without palpated masses or tenderness.    Assessment/Plan:  59 y.o. [id number] female for annual exam.   1. Postmenopausal/atrophic genital changes. Patient without significant hot flushes, night sweats, vaginal dryness or any vaginal bleeding. Continue monitoring and report any issues. Call if any vaginal bleeding. 2. Mammography 04/2015. Continue with annual mammography. SBE monthly reviewed. 3. Pap smear 04/2014. No Pap smear done today. No history of significant abnormal Pap smears. Plan repeat Pap smear at 3 year interval. 4. Colonoscopy scheduled in September. 5. DEXA 2010 normal. Increased calcium vitamin D reviewed. 6. Herpes labialis. Occasional  cold sore outbreaks. Uses Valtrex 500 mg twice a day 5 days which works well for her. Will call when she needs a refill. 7. Health maintenance. No routine blood work done as she has this done at Dr. Willeen CassBalan's office. Follow up in one year, sooner as needed.   Dara LordsFONTAINE,TIMOTHY P MD, 12:06 PM 04/28/2015

## 2015-04-28 NOTE — Patient Instructions (Signed)
You may obtain a copy of any labs that were done today by logging onto MyChart as outlined in the instructions provided with your AVS (after visit summary). The office will not call with normal lab results but certainly if there are any significant abnormalities then we will contact you.   Health Maintenance, Female A healthy lifestyle and preventative care can promote health and wellness.  Maintain regular health, dental, and eye exams.  Eat a healthy diet. Foods like vegetables, fruits, whole grains, low-fat dairy products, and lean protein foods contain the nutrients you need without too many calories. Decrease your intake of foods high in solid fats, added sugars, and salt. Get information about a proper diet from your caregiver, if necessary.  Regular physical exercise is one of the most important things you can do for your health. Most adults should get at least 150 minutes of moderate-intensity exercise (any activity that increases your heart rate and causes you to sweat) each week. In addition, most adults need muscle-strengthening exercises on 2 or more days a week.   Maintain a healthy weight. The body mass index (BMI) is a screening tool to identify possible weight problems. It provides an estimate of body fat based on height and weight. Your caregiver can help determine your BMI, and can help you achieve or maintain a healthy weight. For adults 20 years and older:  A BMI below 18.5 is considered underweight.  A BMI of 18.5 to 24.9 is normal.  A BMI of 25 to 29.9 is considered overweight.  A BMI of 30 and above is considered obese.  Maintain normal blood lipids and cholesterol by exercising and minimizing your intake of saturated fat. Eat a balanced diet with plenty of fruits and vegetables. Blood tests for lipids and cholesterol should begin at age 61 and be repeated every 5 years. If your lipid or cholesterol levels are high, you are over 50, or you are a high risk for heart  disease, you may need your cholesterol levels checked more frequently.Ongoing high lipid and cholesterol levels should be treated with medicines if diet and exercise are not effective.  If you smoke, find out from your caregiver how to quit. If you do not use tobacco, do not start.  Lung cancer screening is recommended for adults aged 33 80 years who are at high risk for developing lung cancer because of a history of smoking. Yearly low-dose computed tomography (CT) is recommended for people who have at least a 30-pack-year history of smoking and are a current smoker or have quit within the past 15 years. A pack year of smoking is smoking an average of 1 pack of cigarettes a day for 1 year (for example: 1 pack a day for 30 years or 2 packs a day for 15 years). Yearly screening should continue until the smoker has stopped smoking for at least 15 years. Yearly screening should also be stopped for people who develop a health problem that would prevent them from having lung cancer treatment.  If you are pregnant, do not drink alcohol. If you are breastfeeding, be very cautious about drinking alcohol. If you are not pregnant and choose to drink alcohol, do not exceed 1 drink per day. One drink is considered to be 12 ounces (355 mL) of beer, 5 ounces (148 mL) of wine, or 1.5 ounces (44 mL) of liquor.  Avoid use of street drugs. Do not share needles with anyone. Ask for help if you need support or instructions about stopping  the use of drugs.  High blood pressure causes heart disease and increases the risk of stroke. Blood pressure should be checked at least every 1 to 2 years. Ongoing high blood pressure should be treated with medicines, if weight loss and exercise are not effective.  If you are 59 to 60 years old, ask your caregiver if you should take aspirin to prevent strokes.  Diabetes screening involves taking a blood sample to check your fasting blood sugar level. This should be done once every 3  years, after age 91, if you are within normal weight and without risk factors for diabetes. Testing should be considered at a younger age or be carried out more frequently if you are overweight and have at least 1 risk factor for diabetes.  Breast cancer screening is essential preventative care for women. You should practice "breast self-awareness." This means understanding the normal appearance and feel of your breasts and may include breast self-examination. Any changes detected, no matter how small, should be reported to a caregiver. Women in their 66s and 30s should have a clinical breast exam (CBE) by a caregiver as part of a regular health exam every 1 to 3 years. After age 101, women should have a CBE every year. Starting at age 100, women should consider having a mammogram (breast X-ray) every year. Women who have a family history of breast cancer should talk to their caregiver about genetic screening. Women at a high risk of breast cancer should talk to their caregiver about having an MRI and a mammogram every year.  Breast cancer gene (BRCA)-related cancer risk assessment is recommended for women who have family members with BRCA-related cancers. BRCA-related cancers include breast, ovarian, tubal, and peritoneal cancers. Having family members with these cancers may be associated with an increased risk for harmful changes (mutations) in the breast cancer genes BRCA1 and BRCA2. Results of the assessment will determine the need for genetic counseling and BRCA1 and BRCA2 testing.  The Pap test is a screening test for cervical cancer. Women should have a Pap test starting at age 57. Between ages 25 and 35, Pap tests should be repeated every 2 years. Beginning at age 37, you should have a Pap test every 3 years as long as the past 3 Pap tests have been normal. If you had a hysterectomy for a problem that was not cancer or a condition that could lead to cancer, then you no longer need Pap tests. If you are  between ages 50 and 76, and you have had normal Pap tests going back 10 years, you no longer need Pap tests. If you have had past treatment for cervical cancer or a condition that could lead to cancer, you need Pap tests and screening for cancer for at least 20 years after your treatment. If Pap tests have been discontinued, risk factors (such as a new sexual partner) need to be reassessed to determine if screening should be resumed. Some women have medical problems that increase the chance of getting cervical cancer. In these cases, your caregiver may recommend more frequent screening and Pap tests.  The human papillomavirus (HPV) test is an additional test that may be used for cervical cancer screening. The HPV test looks for the virus that can cause the cell changes on the cervix. The cells collected during the Pap test can be tested for HPV. The HPV test could be used to screen women aged 44 years and older, and should be used in women of any age  who have unclear Pap test results. After the age of 55, women should have HPV testing at the same frequency as a Pap test.  Colorectal cancer can be detected and often prevented. Most routine colorectal cancer screening begins at the age of 44 and continues through age 20. However, your caregiver may recommend screening at an earlier age if you have risk factors for colon cancer. On a yearly basis, your caregiver may provide home test kits to check for hidden blood in the stool. Use of a small camera at the end of a tube, to directly examine the colon (sigmoidoscopy or colonoscopy), can detect the earliest forms of colorectal cancer. Talk to your caregiver about this at age 86, when routine screening begins. Direct examination of the colon should be repeated every 5 to 10 years through age 13, unless early forms of pre-cancerous polyps or small growths are found.  Hepatitis C blood testing is recommended for all people born from [birth year] through 1966 and any  individual with known risks for hepatitis C.  Practice safe sex. Use condoms and avoid high-risk sexual practices to reduce the spread of sexually transmitted infections (STIs). Sexually active women aged 36 and younger should be checked for Chlamydia, which is a common sexually transmitted infection. Older women with new or multiple partners should also be tested for Chlamydia. Testing for other STIs is recommended if you are sexually active and at increased risk.  Osteoporosis is a disease in which the bones lose minerals and strength with aging. This can result in serious bone fractures. The risk of osteoporosis can be identified using a bone density scan. Women ages 20 and over and women at risk for fractures or osteoporosis should discuss screening with their caregivers. Ask your caregiver whether you should be taking a calcium supplement or vitamin D to reduce the rate of osteoporosis.  Menopause can be associated with physical symptoms and risks. Hormone replacement therapy is available to decrease symptoms and risks. You should talk to your caregiver about whether hormone replacement therapy is right for you.  Use sunscreen. Apply sunscreen liberally and repeatedly throughout the day. You should seek shade when your shadow is shorter than you. Protect yourself by wearing long sleeves, pants, a wide-brimmed hat, and sunglasses year round, whenever you are outdoors.  Notify your caregiver of new moles or changes in moles, especially if there is a change in shape or color. Also notify your caregiver if a mole is larger than the size of a pencil eraser.  Stay current with your immunizations. Document Released: 05/03/2011 Document Revised: 02/12/2013 Document Reviewed: 05/03/2011 [facility] Patient Information 2014 [employer].

## 2016-03-02 ENCOUNTER — Other Ambulatory Visit: Payer: Self-pay

## 2016-03-02 DIAGNOSIS — Z1231 Encounter for screening mammogram for malignant neoplasm of breast: Secondary | ICD-10-CM

## 2016-04-08 ENCOUNTER — Ambulatory Visit
Admission: RE | Admit: 2016-04-08 | Discharge: 2016-04-08 | Disposition: A | Discharge status: Home / Self Care | Payer: 59 | Source: Ambulatory Visit

## 2016-04-08 DIAGNOSIS — Z1231 Encounter for screening mammogram for malignant neoplasm of breast: Secondary | ICD-10-CM

## 2016-04-28 ENCOUNTER — Ambulatory Visit (INDEPENDENT_AMBULATORY_CARE_PROVIDER_SITE_OTHER): Payer: 59 | Admitting: Gynecology

## 2016-04-28 ENCOUNTER — Encounter: Payer: Self-pay | Admitting: Gynecology

## 2016-04-28 VITALS — BP 124/80 | Ht 69.0 in | Wt 208.0 lb

## 2016-04-28 DIAGNOSIS — Z01419 Encounter for gynecological examination (general) (routine) without abnormal findings: Secondary | ICD-10-CM

## 2016-04-28 DIAGNOSIS — Z1321 Encounter for screening for nutritional disorder: Secondary | ICD-10-CM | POA: Diagnosis not present

## 2016-04-28 DIAGNOSIS — Z13220 Encounter for screening for lipoid disorders: Secondary | ICD-10-CM

## 2016-04-28 LAB — CBC WITH DIFFERENTIAL/PLATELET
Basophils Absolute: 54 cells/uL (ref 0–200)
Basophils Relative: 1 %
Eosinophils Absolute: 108 cells/uL (ref 15–500)
Eosinophils Relative: 2 %
HCT: 45.1 % — ABNORMAL HIGH (ref 35.0–45.0)
Hemoglobin: 15.3 g/dL (ref 11.7–15.5)
Lymphocytes Relative: 29 %
Lymphs Abs: 1566 cells/uL (ref 850–3900)
MCH: 29.6 pg (ref 27.0–33.0)
MCHC: 33.9 g/dL (ref 32.0–36.0)
MCV: 87.2 fL (ref 80.0–100.0)
MPV: 10.5 fL (ref 7.5–12.5)
Monocytes Absolute: 378 cells/uL (ref 200–950)
Monocytes Relative: 7 %
Neutro Abs: 3294 cells/uL (ref 1500–7800)
Neutrophils Relative %: 61 %
Platelets: 220 10*3/uL (ref 140–400)
RBC: 5.17 MIL/uL — ABNORMAL HIGH (ref 3.80–5.10)
RDW: 12.7 % (ref 11.0–15.0)
WBC: 5.4 10*3/uL (ref 3.8–10.8)

## 2016-04-28 LAB — COMPREHENSIVE METABOLIC PANEL
ALT: 11 U/L (ref 6–29)
AST: 14 U/L (ref 10–35)
Albumin: 4.2 g/dL (ref 3.6–5.1)
Alkaline Phosphatase: 76 U/L (ref 33–130)
BUN: 10 mg/dL (ref 7–25)
CO2: 24 mmol/L (ref 20–31)
Calcium: 9.2 mg/dL (ref 8.6–10.4)
Chloride: 103 mmol/L (ref 98–110)
Creat: 0.79 mg/dL (ref 0.50–0.99)
Glucose, Bld: 79 mg/dL (ref 65–99)
Potassium: 4.6 mmol/L (ref 3.5–5.3)
Sodium: 139 mmol/L (ref 135–146)
Total Bilirubin: 0.4 mg/dL (ref 0.2–1.2)
Total Protein: 6.5 g/dL (ref 6.1–8.1)

## 2016-04-28 LAB — LIPID PANEL
Cholesterol: 176 mg/dL (ref 125–200)
HDL: 58 mg/dL (ref >=46)
LDL Cholesterol: 99 mg/dL (ref <130)
Total CHOL/HDL Ratio: 3 Ratio (ref <=5.0)
Triglycerides: 95 mg/dL (ref <150)
VLDL: 19 mg/dL (ref <30)

## 2016-04-28 NOTE — Patient Instructions (Signed)

## 2016-04-28 NOTE — Progress Notes (Signed)
    Donna Padilla [birth year] [id number]        60 y.o.  G2P2002  for annual exam.  Doing well.  Past medical history,surgical history, problem list, medications, allergies, family history and social history were all reviewed and documented as reviewed in the EPIC chart.  ROS:  Performed with pertinent positives and negatives included in the history, assessment and plan.   Additional significant findings :  None   Exam: Donna PortelaKim Padilla assistant Filed Vitals:   04/28/16 1159  BP: 124/80  Height: 5\' 9"  (1.753 m)  Weight: 208 lb (94.348 kg)   General appearance:  Normal affect, orientation and appearance. Skin: Grossly normal HEENT: Without gross lesions.  No cervical or supraclavicular adenopathy. Thyroid normal.  Lungs:  Clear without wheezing, rales or rhonchi Cardiac: RR, without RMG Abdominal:  Soft, nontender, without masses, guarding, rebound, organomegaly or hernia Breasts:  Examined lying and sitting without masses, retractions, discharge or axillary adenopathy. Pelvic:  Ext/BUS/vagina normal with mild atrophic changes  Cervix normal with atrophic changes  Uterus anteverted, normal size, shape and contour, midline and mobile nontender   Adnexa without masses or tenderness    Anus and perineum normal   Rectovaginal normal sphincter tone without palpated masses or tenderness.    Assessment/Plan:  60 y.o. 882P2002 female for annual exam.   1. Postmenopausal/atrophic genital changes. No significant hot flushes, night sweats, vaginal dryness or any vaginal bleeding. Continue to monitor report any issues or vaginal bleeding. 2. Mammography 04/2016. Continue with annual mammography when due. SBE monthly reviewed. 3. DEXA 2010 normal. Plan repeat closer to age 60. Increased calcium and vitamin D reviewed. 4. Pap smear/HPV negative 04/2014. No Pap smear done today. No history of significant abnormal Pap smears. Plan repeat Pap smear closer to 5 year interval per current screening  guidelines. 5. Colonoscopy 2006. Patient knows she is overdue and is in the process of scheduling. SBE monthly reviewed. 6. Herpes labialis. Occasional cold sores for which she uses Valtrex 500 mg twice a day 5 days. Has supply at home but will call when she needs more. 7. Health maintenance. Followed for thyroid per Donna Padilla. Requests baseline labs. CBC, CMP, lipid profile, urinalysis, vitamin D ordered. Follow up 1 year, sooner as needed.   Donna Padilla,Donna Padilla, 12:35 PM 04/28/2016

## 2016-04-29 ENCOUNTER — Other Ambulatory Visit: Payer: Self-pay

## 2016-04-29 ENCOUNTER — Other Ambulatory Visit: Payer: Self-pay | Admitting: Gynecology

## 2016-04-29 DIAGNOSIS — E559 Vitamin D deficiency, unspecified: Secondary | ICD-10-CM

## 2016-04-29 LAB — URINALYSIS W MICROSCOPIC + REFLEX CULTURE
Bacteria, UA: NONE SEEN [HPF]
Bilirubin Urine: NEGATIVE
Casts: NONE SEEN [LPF]
Crystals: NONE SEEN [HPF]
Glucose, UA: NEGATIVE
Hgb urine dipstick: NEGATIVE
Ketones, ur: NEGATIVE
Leukocytes, UA: NEGATIVE
Nitrite: NEGATIVE
Protein, ur: NEGATIVE
RBC / HPF: NONE SEEN RBC/HPF (ref <=2)
Specific Gravity, Urine: 1.008 (ref 1.001–1.035)
Squamous Epithelial / LPF: NONE SEEN [HPF] (ref <=5)
WBC, UA: NONE SEEN WBC/HPF (ref <=5)
Yeast: NONE SEEN [HPF]
pH: 6.5 (ref 5.0–8.0)

## 2016-04-29 LAB — VITAMIN D 25 HYDROXY (VIT D DEFICIENCY, FRACTURES): Vit D, 25-Hydroxy: 26 ng/mL — ABNORMAL LOW (ref 30–100)

## 2016-04-29 MED ORDER — VALACYCLOVIR HCL 500 MG PO TABS: 500.0000 mg | ORAL_TABLET | Freq: Two times a day (BID) | ORAL | Status: DC

## 2016-04-29 NOTE — Telephone Encounter (Signed)
Patient requested you send her Rx in for Valtrex caps. Is out and forgot to ask at visit.

## 2016-04-29 NOTE — Telephone Encounter (Signed)
-----   Message from Dara Lordsimothy P Fontaine, MD sent at 04/29/2016  8:59 AM EDT ----- Tell patient her vitamin D level is low. Recommend 2000 units OTC additional vitamin D daily. Recheck vitamin D level in 3-6 months.

## 2017-04-29 ENCOUNTER — Encounter: Payer: 59 | Admitting: Gynecology

## 2017-05-12 ENCOUNTER — Other Ambulatory Visit: Payer: Self-pay | Admitting: Gynecology

## 2017-05-12 DIAGNOSIS — Z1231 Encounter for screening mammogram for malignant neoplasm of breast: Secondary | ICD-10-CM

## 2017-05-18 ENCOUNTER — Ambulatory Visit: Payer: 59

## 2017-06-13 ENCOUNTER — Encounter: Payer: Self-pay | Admitting: Gynecology

## 2017-06-13 ENCOUNTER — Ambulatory Visit
Admission: RE | Admit: 2017-06-13 | Discharge: 2017-06-13 | Disposition: A | Discharge status: Home / Self Care | Payer: 59 | Source: Ambulatory Visit | Attending: Gynecology | Admitting: Gynecology

## 2017-06-13 ENCOUNTER — Ambulatory Visit (INDEPENDENT_AMBULATORY_CARE_PROVIDER_SITE_OTHER): Payer: 59 | Admitting: Gynecology

## 2017-06-13 VITALS — BP 120/74 | Ht 69.5 in | Wt 214.0 lb

## 2017-06-13 DIAGNOSIS — Z01411 Encounter for gynecological examination (general) (routine) with abnormal findings: Secondary | ICD-10-CM | POA: Diagnosis not present

## 2017-06-13 DIAGNOSIS — N952 Postmenopausal atrophic vaginitis: Secondary | ICD-10-CM

## 2017-06-13 DIAGNOSIS — E559 Vitamin D deficiency, unspecified: Secondary | ICD-10-CM

## 2017-06-13 DIAGNOSIS — Z1231 Encounter for screening mammogram for malignant neoplasm of breast: Secondary | ICD-10-CM

## 2017-06-13 MED ORDER — VITAMIN D (ERGOCALCIFEROL) 1.25 MG (50000 UNIT) PO CAPS: 50000.0000 [IU] | ORAL_CAPSULE | ORAL | 0 refills | Status: DC

## 2017-06-13 NOTE — Patient Instructions (Signed)
Follow up with Dr Loreta AveMann for your colonoscopy  Follow up after 12 weeks to have you Vit D level checked at Dr Willeen CassBalan's office

## 2017-06-13 NOTE — Progress Notes (Signed)
    Donna FudgeDonna Axtman [birth year] [id number]        61 y.o.  G2P2002 for annual exam.     Past medical history,surgical history, problem list, medications, allergies, family history and social history were all reviewed and documented as reviewed in the EPIC chart.  ROS:  Performed with pertinent positives and negatives included in the history, assessment and plan.   Additional significant findings :  None   Exam: Kennon PortelaKim Gardner assistant Vitals:   06/13/17 0756  BP: 120/74  Weight: 214 lb (97.1 kg)  Height: 5' 9.5" (1.765 m)   Body mass index is 31.15 kg/m.  General appearance:  Normal affect, orientation and appearance. Skin: Grossly normal HEENT: Without gross lesions.  No cervical or supraclavicular adenopathy. Thyroid normal.  Lungs:  Clear without wheezing, rales or rhonchi Cardiac: RR, without RMG Abdominal:  Soft, nontender, without masses, guarding, rebound, organomegaly or hernia Breasts:  Examined lying and sitting without masses, retractions, discharge or axillary adenopathy. Pelvic:  Ext, BUS, Vagina: With atrophic changes  Cervix: With atrophic changes  Uterus: Anteverted, normal size, shape and contour, midline and mobile nontender   Adnexa: Without masses or tenderness    Anus and perineum: Normal   Rectovaginal: Normal sphincter tone without palpated masses or tenderness.    Assessment/Plan:  61 y.o. 692P2002 female for annual exam.   1. Postmenopausal/atrophic genital changes. No significant hot flushes, night sweats, vaginal dryness or any vaginal bleeding. Continue to monitor report any issues or bleeding. 2. Mammography today. Breast exam normal. Self breast awareness reviewed. 3. DEXA 2010 normal. Plan repeat DEXA at age 61. Recently found to be vitamin D deficient by Dr. Talmage NapBalan. Started on 2000 units daily but has a hard time remembering this. We'll treat with 50,000 units weekly 12 weeks. We'll then restart her 2000 units daily. She prefers to follow up with  Dr. Talmage NapBalan to have her vitamin D level rechecked and will do so at the end of her 12 week course.. 4. Colonoscopy 2006. Had scheduled with Dr. Loreta AveMann but canceled it. Strongly recommended she call and reschedule it and she agrees to do so. 5. Herpes labialis. Occasional cold sores. Uses Valtrex 500 mg twice a day 5 days. Has supply but will call when she needs more. 6. Pap smear/HPV 2015. No Pap smear done today. No history of abnormal Pap smears. Plan repeat Pap smear at 5 year interval per current screening guidelines. 7. Health maintenance. No routine blood work done as she believes Dr. Talmage NapBalan did all this for her. She's going to call her office just to verify that she checked routine lab work to include lipid profile. Follow up in one year, sooner as needed.   Dara LordsFONTAINE,TIMOTHY P MD, 8:24 AM 06/13/2017

## 2017-06-29 ENCOUNTER — Encounter: Payer: 59 | Admitting: Gynecology

## 2017-09-15 ENCOUNTER — Other Ambulatory Visit: Payer: Self-pay | Admitting: Gynecology

## 2017-10-08 ENCOUNTER — Other Ambulatory Visit: Payer: Self-pay | Admitting: Gynecology

## 2017-10-18 ENCOUNTER — Other Ambulatory Visit: Payer: Self-pay

## 2018-06-14 ENCOUNTER — Encounter: Payer: 59 | Admitting: Gynecology

## 2018-06-26 ENCOUNTER — Ambulatory Visit (INDEPENDENT_AMBULATORY_CARE_PROVIDER_SITE_OTHER): Payer: BLUE CROSS/BLUE SHIELD | Admitting: Gynecology

## 2018-06-26 ENCOUNTER — Encounter: Payer: Self-pay | Admitting: Gynecology

## 2018-06-26 VITALS — BP 124/80 | Ht 69.0 in | Wt 222.0 lb

## 2018-06-26 DIAGNOSIS — N952 Postmenopausal atrophic vaginitis: Secondary | ICD-10-CM | POA: Diagnosis not present

## 2018-06-26 DIAGNOSIS — Z1151 Encounter for screening for human papillomavirus (HPV): Secondary | ICD-10-CM

## 2018-06-26 DIAGNOSIS — Z01419 Encounter for gynecological examination (general) (routine) without abnormal findings: Secondary | ICD-10-CM | POA: Diagnosis not present

## 2018-06-26 NOTE — Progress Notes (Signed)
    Donna FudgeDonna Padilla 11-08-1955 [id number]        62 y.o.  G2P2002 for annual gynecologic exam.  Without gynecologic complaints  Past medical history,surgical history, problem list, medications, allergies, family history and social history were all reviewed and documented as reviewed in the EPIC chart.  ROS:  Performed with pertinent positives and negatives included in the history, assessment and plan.   Additional significant findings : None   Exam: Kennon PortelaKim Gardner assistant Vitals:   06/26/18 1528  BP: 124/80  Weight: 222 lb (100.7 kg)  Height: 5\' 9"  (1.753 m)   Body mass index is 32.78 kg/m.  General appearance:  Normal affect, orientation and appearance. Skin: Grossly normal HEENT: Without gross lesions.  No cervical or supraclavicular adenopathy. Thyroid normal.  Lungs:  Clear without wheezing, rales or rhonchi Cardiac: RR, without RMG Abdominal:  Soft, nontender, without masses, guarding, rebound, organomegaly or hernia Breasts:  Examined lying and sitting without masses, retractions, discharge or axillary adenopathy. Pelvic:  Ext, BUS, Vagina: With atrophic changes  Cervix: With atrophic changes Pap smear/HPV done  Uterus: Anteverted, normal size, shape and contour, midline and mobile nontender   Adnexa: Without masses or tenderness    Anus and perineum: Normal   Rectovaginal: Normal sphincter tone without palpated masses or tenderness.    Assessment/Plan:  62 y.o. 422P2002 female for annual gynecologic exam.   1. Postmenopausal/atrophic genital changes.  No significant menopausal symptoms or any bleeding. 2. Pap smear/HPV 2015.  Pap smear/HPV done today at 4 to 5-year interval.  No history of abnormal Pap smears previously. 3. History of herpes labialis.  Uses occasional Valtrex twice daily for 5 days with outbreak.  Has supply at home but will call if needs more. 4. Colonoscopy 2006.  Reminded patient she is overdue.  Patient agrees to call and schedule. 5. Mammography  due now and I reminded patient to schedule.  Breast exam normal today. 6. DEXA 2010 normal.  Recommend repeat DEXA at age 62.  History of vitamin D deficiency.  Has lab work done through Dr. Willeen CassBalan's office.  We will follow-up with her for vitamin D level. 7. Health maintenance.  No routine lab work done as patient does this at Dr. Willeen CassBalan"s office.  Follow-up 1 year, sooner as needed.   Dara Lordsimothy P Fontaine MD, 4:15 PM 06/26/2018

## 2018-06-26 NOTE — Patient Instructions (Addendum)
Schedule your mammogram  Schedule your colonoscopy  Follow up in one year for your annual exam

## 2018-06-27 LAB — PAP IG AND HPV HIGH-RISK: HPV DNA High Risk: NOT DETECTED

## 2018-11-17 ENCOUNTER — Other Ambulatory Visit: Payer: Self-pay | Admitting: Gynecology

## 2018-11-17 DIAGNOSIS — Z1231 Encounter for screening mammogram for malignant neoplasm of breast: Secondary | ICD-10-CM

## 2018-12-21 ENCOUNTER — Ambulatory Visit: Payer: 59

## 2019-01-22 ENCOUNTER — Ambulatory Visit: Payer: 59

## 2019-03-20 ENCOUNTER — Ambulatory Visit: Payer: 59

## 2019-06-11 ENCOUNTER — Ambulatory Visit
Admission: RE | Admit: 2019-06-11 | Discharge: 2019-06-11 | Disposition: A | Discharge status: Home / Self Care | Payer: 59 | Source: Ambulatory Visit | Attending: Gynecology | Admitting: Gynecology

## 2019-06-11 ENCOUNTER — Other Ambulatory Visit: Payer: Self-pay

## 2019-06-11 DIAGNOSIS — Z1231 Encounter for screening mammogram for malignant neoplasm of breast: Secondary | ICD-10-CM

## 2019-06-28 ENCOUNTER — Encounter: Payer: BLUE CROSS/BLUE SHIELD | Admitting: Gynecology

## 2019-06-28 ENCOUNTER — Other Ambulatory Visit: Payer: Self-pay

## 2019-06-29 ENCOUNTER — Ambulatory Visit: Payer: BC Managed Care – PPO | Admitting: Gynecology

## 2019-06-29 ENCOUNTER — Encounter: Payer: Self-pay | Admitting: Gynecology

## 2019-06-29 VITALS — BP 118/76 | Ht 69.0 in | Wt 220.0 lb

## 2019-06-29 DIAGNOSIS — E559 Vitamin D deficiency, unspecified: Secondary | ICD-10-CM

## 2019-06-29 DIAGNOSIS — Z01419 Encounter for gynecological examination (general) (routine) without abnormal findings: Secondary | ICD-10-CM

## 2019-06-29 DIAGNOSIS — N952 Postmenopausal atrophic vaginitis: Secondary | ICD-10-CM | POA: Diagnosis not present

## 2019-06-29 NOTE — Progress Notes (Signed)
    Donna Padilla 10-19-1956 [id number]        63 y.o.  [id number] for annual gynecologic exam.  Without gynecologic complaints  Past medical history,surgical history, problem list, medications, allergies, family history and social history were all reviewed and documented as reviewed in the EPIC chart.  ROS:  Performed with pertinent positives and negatives included in the history, assessment and plan.   Additional significant findings : None   Exam: [name] assistant Vitals:   06/29/19 1135  BP: 118/76  Weight: 220 lb (99.8 kg)  Height: 5\' 9"  (1.753 m)   Body mass index is 32.49 kg/m.  General appearance:  Normal affect, orientation and appearance. Skin: Grossly normal HEENT: Without gross lesions.  No cervical or supraclavicular adenopathy. Thyroid normal.  Lungs:  Clear without wheezing, rales or rhonchi Cardiac: RR, without RMG Abdominal:  Soft, nontender, without masses, guarding, rebound, organomegaly or hernia Breasts:  Examined lying and sitting without masses, retractions, discharge or axillary adenopathy. Pelvic:  Ext, BUS, Vagina: Normal with atrophic changes  Cervix: Normal with atrophic changes  Uterus: Anteverted, normal size, shape and contour, midline and mobile nontender   Adnexa: Without masses or tenderness    Anus and perineum: Normal   Rectovaginal: Normal sphincter tone without palpated masses or tenderness.    Assessment/Plan:  63 y.o. G91P2002 female for annual gynecologic exam.   1. Postmenopausal.  No significant menopausal symptoms or any vaginal bleeding. 2. Pap smear/HPV normal 2019.  No Pap smear done today.  No history of abnormal Pap smears. 3. Colonoscopy 2006.  I again reminded patient she is overdue and recommended she schedule a screening colonoscopy. 4. Mammography 06/2019.  Continue with annual mammography next year.  Breast exam normal today. 5. DEXA 2010 normal.  Plan repeat DEXA next year at age 26.  History of vitamin D  deficiency.  Was to have vitamin D level checked but never followed up for this.  Check vitamin D level today. 6. Health maintenance.  No routine lab work done as patient reports this done elsewhere.  Follow-up 1 year, sooner as needed.   Anastasio Auerbach MD, 11:58 AM 06/29/2019

## 2019-06-29 NOTE — Patient Instructions (Signed)
Schedule your colonoscopy

## 2019-06-30 LAB — VITAMIN D 25 HYDROXY (VIT D DEFICIENCY, FRACTURES): Vit D, 25-Hydroxy: 23 ng/mL — ABNORMAL LOW (ref 30–100)

## 2019-07-02 ENCOUNTER — Other Ambulatory Visit: Payer: Self-pay

## 2019-07-02 DIAGNOSIS — E559 Vitamin D deficiency, unspecified: Secondary | ICD-10-CM

## 2019-07-02 MED ORDER — VITAMIN D (ERGOCALCIFEROL) 1.25 MG (50000 UNIT) PO CAPS: 50000.0000 [IU] | ORAL_CAPSULE | ORAL | 0 refills | Status: DC

## 2019-07-31 ENCOUNTER — Encounter: Payer: Self-pay | Admitting: Gynecology

## 2020-06-30 ENCOUNTER — Encounter: Payer: BC Managed Care – PPO | Admitting: Obstetrics and Gynecology

## 2020-07-02 ENCOUNTER — Other Ambulatory Visit: Payer: Self-pay | Admitting: Obstetrics and Gynecology

## 2020-07-02 DIAGNOSIS — Z1231 Encounter for screening mammogram for malignant neoplasm of breast: Secondary | ICD-10-CM

## 2020-07-11 ENCOUNTER — Encounter: Payer: BC Managed Care – PPO | Admitting: Obstetrics and Gynecology

## 2020-08-22 ENCOUNTER — Encounter: Payer: Self-pay | Admitting: Obstetrics and Gynecology

## 2020-08-22 ENCOUNTER — Ambulatory Visit
Admission: RE | Admit: 2020-08-22 | Discharge: 2020-08-22 | Disposition: A | Discharge status: Home / Self Care | Payer: Medicare Other | Source: Ambulatory Visit | Attending: Obstetrics and Gynecology | Admitting: Obstetrics and Gynecology

## 2020-08-22 ENCOUNTER — Ambulatory Visit (INDEPENDENT_AMBULATORY_CARE_PROVIDER_SITE_OTHER): Payer: Medicare Other | Admitting: Obstetrics and Gynecology

## 2020-08-22 ENCOUNTER — Other Ambulatory Visit: Payer: Self-pay

## 2020-08-22 VITALS — BP 124/80 | Ht 69.0 in | Wt 221.0 lb

## 2020-08-22 DIAGNOSIS — Z9289 Personal history of other medical treatment: Secondary | ICD-10-CM

## 2020-08-22 DIAGNOSIS — Z01419 Encounter for gynecological examination (general) (routine) without abnormal findings: Secondary | ICD-10-CM

## 2020-08-22 DIAGNOSIS — Z23 Encounter for immunization: Secondary | ICD-10-CM | POA: Diagnosis not present

## 2020-08-22 DIAGNOSIS — E559 Vitamin D deficiency, unspecified: Secondary | ICD-10-CM

## 2020-08-22 DIAGNOSIS — Z780 Asymptomatic menopausal state: Secondary | ICD-10-CM

## 2020-08-22 DIAGNOSIS — Z1231 Encounter for screening mammogram for malignant neoplasm of breast: Secondary | ICD-10-CM

## 2020-08-22 NOTE — Progress Notes (Signed)
   Donna Padilla 12/21/1955 [id number]  SUBJECTIVE:  64 y.o. [phone] female here for a breast and pelvic exam. She has no gynecologic concerns.  Only notes urinary incontinence/urgency after she has been holding her bladder for several hours as she forgets to void sometimes when busy at work.   Current Outpatient Medications  Medication Sig Dispense Refill  . ibuprofen (ADVIL,MOTRIN) 200 MG tablet Take 200 mg by mouth every 6 (six) hours as needed.    . Levothyroxine Sodium (SYNTHROID PO) Take 137 mcg by mouth.     . valACYclovir (VALTREX) 500 MG tablet Take 1 tablet (500 mg total) by mouth 2 (two) times daily. 20 tablet 5   No current facility-administered medications for this visit.   Allergies: Patient has no known allergies.  Patient's last menstrual period was 09/01/2010.  Past medical history,surgical history, problem list, medications, allergies, family history and social history were all reviewed and documented as reviewed in the EPIC chart.  GYN ROS: no abnormal bleeding, pelvic pain or discharge, no breast pain or new or enlarging lumps on self exam.  No dysuria, pain with urination, cloudy/malodorous urine.   OBJECTIVE:  Ht 5\' 9"  (1.753 m)   Wt 221 lb (100.2 kg)   LMP 09/01/2010   BMI 32.64 kg/m  The patient appears well, alert, oriented, in no distress.  BREAST EXAM: breasts appear normal, no suspicious masses, no skin or nipple changes or axillary nodes, Mild rash in right breast inframamillary fold  PELVIC EXAM: VULVA: normal appearing vulva with atrophic change, no masses, tenderness or lesions, VAGINA: normal appearing vagina with atrophic change, normal color and discharge, no lesions, CERVIX: normal appearing atrophic cervix without discharge or lesions, UTERUS: uterus is normal size, shape, consistency and nontender, ADNEXA: normal adnexa in size, nontender and no masses  Chaperone: 13/11/2009 present during the examination  ASSESSMENT:  64 y.o. 75 here  for a breast and pelvic exam  PLAN:   1. Postmenopausal.  No significant hot flashes or night sweats.  No vaginal bleeding.  In regard to urinary urgency I recommended that she try to empty her bladder on a more frequent schedule and if continue to have urgency symptoms she should let [id number] know and we can look further into doing work-up and referral as needed. 2. Pap smear/HPV 2019.  No significant history of abnormal Pap smears.  Next Pap smear due 2024 following the current guidelines recommending the 5 year interval. 3. Mammogram today.  Normal breast exam today.  Mild rash in right breast inframamillary fold likely due to moisture, recommend using OTC hydrocortisone 1% as needed.  Continuing with annual mammograms. 4. Colonoscopy reported at age 21 per patient (I do not have records of this).  She will follow up at the interval recommended by her GI specialist.   5. DEXA 2010 was normal.  Next DEXA recommended this year as she is age 5.  History of vitamin D deficiency noted.  She was taking a high-dose vitamin D replacement and then stopped.  Recommend rechecking vitamin D level.   She will plan to do both of these items with her endocrinologist at her next upcoming appointment soon.   6. Health maintenance.  No labs today as she normally has these completed elsewhere.  Return annually or sooner, prn.  76 MD 08/22/20

## 2020-08-26 NOTE — Addendum Note (Signed)
Addended by: Dayna Barker on: 08/26/2020 09:10 AM   Modules accepted: Orders

## 2020-09-05 DIAGNOSIS — M189 Osteoarthritis of first carpometacarpal joint, unspecified: Secondary | ICD-10-CM | POA: Insufficient documentation

## 2020-12-30 IMAGING — MG DIGITAL SCREENING BILATERAL MAMMOGRAM WITH TOMO AND CAD
8 series · 8 of 24 positions shown · non-contrast
Comparison: Previous exam(s).

CLINICAL DATA: Screening.

EXAM:
DIGITAL SCREENING BILATERAL MAMMOGRAM WITH TOMO AND CAD

[L CC synth-2D]
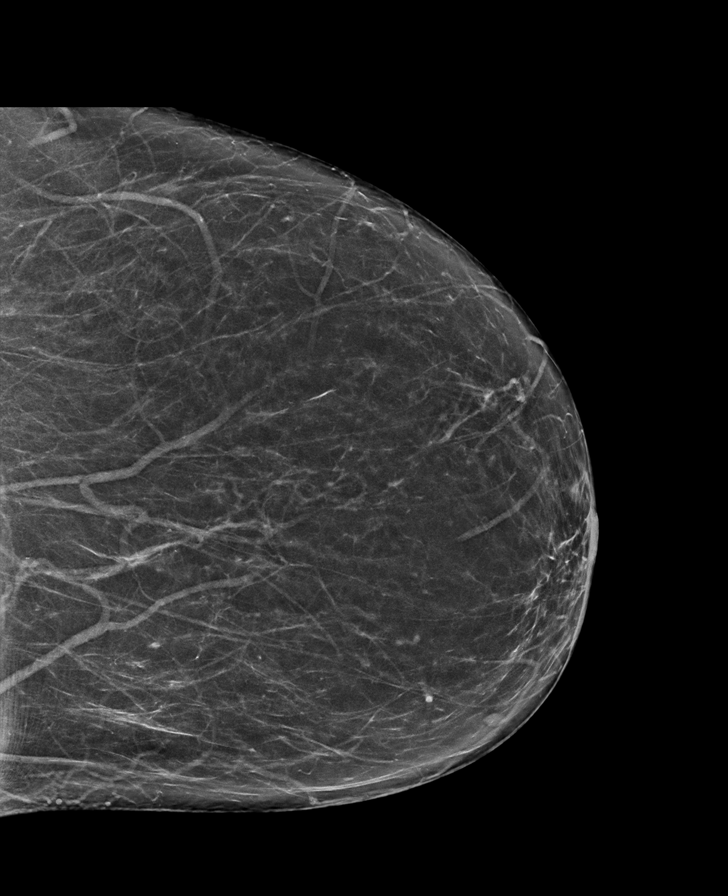

[L MLO synth-2D]
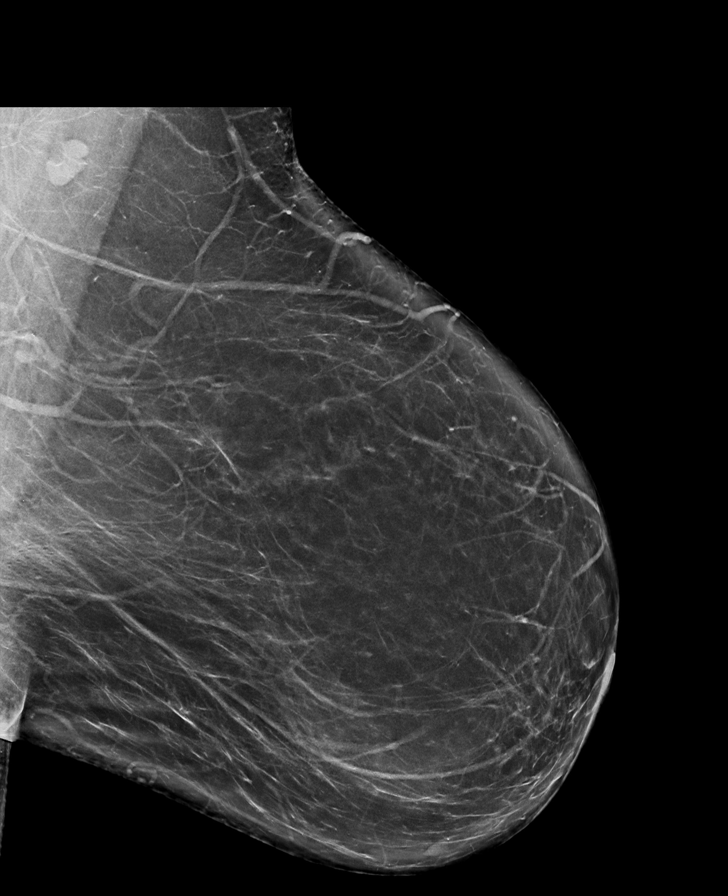

[R CC synth-2D]
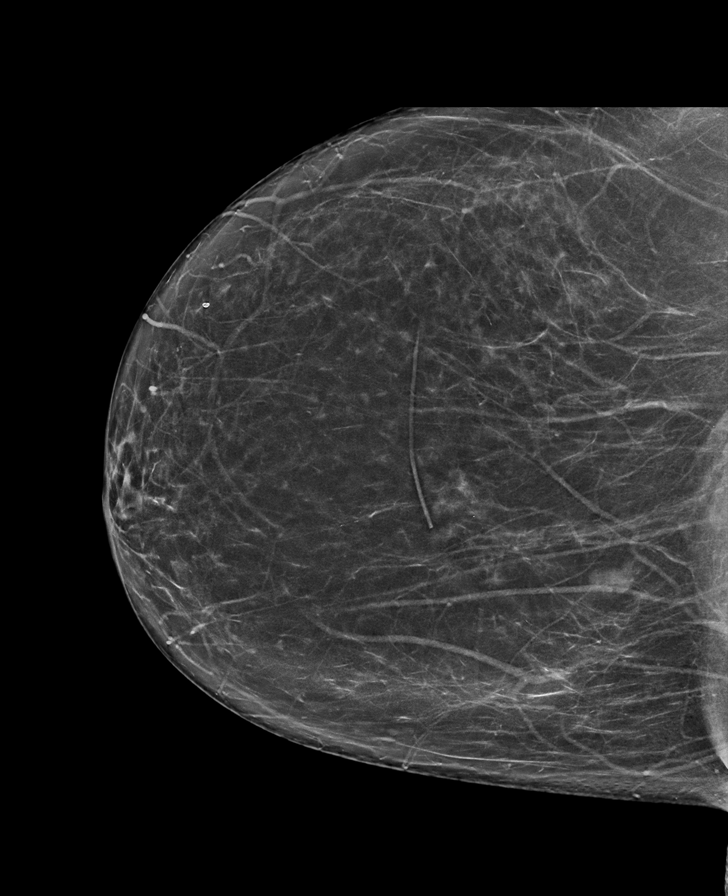

[R MLO synth-2D]
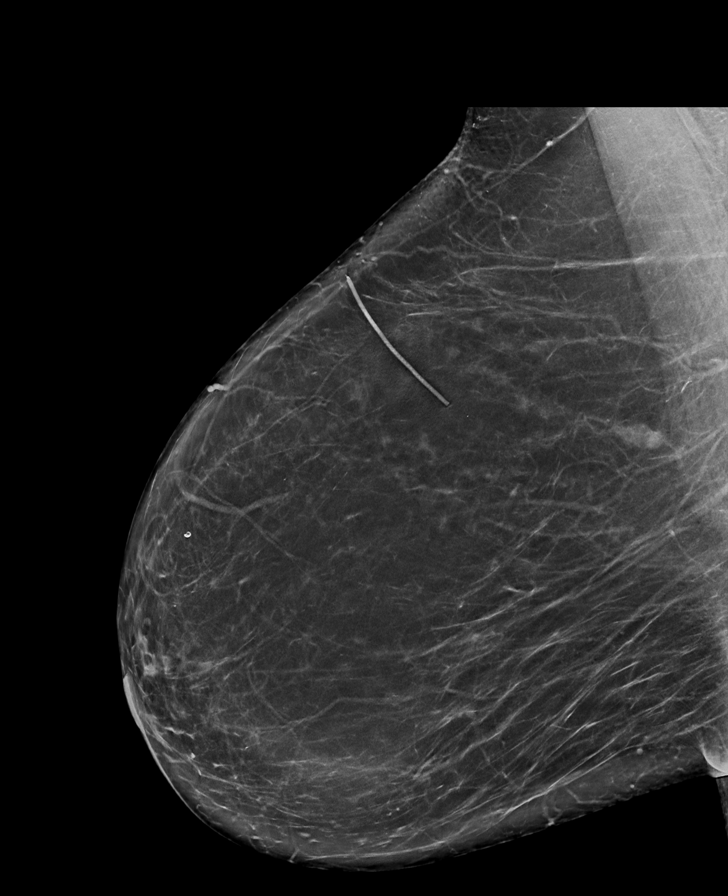

[R MLO tomo · tomo slice 43/86.0]
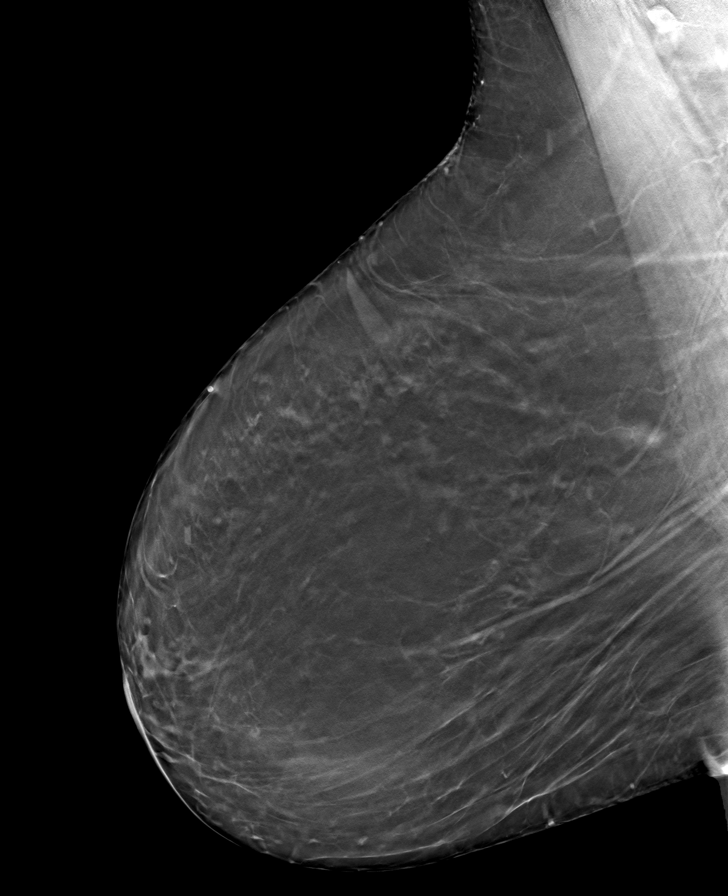

[L MLO tomo · tomo slice 45/89.0]
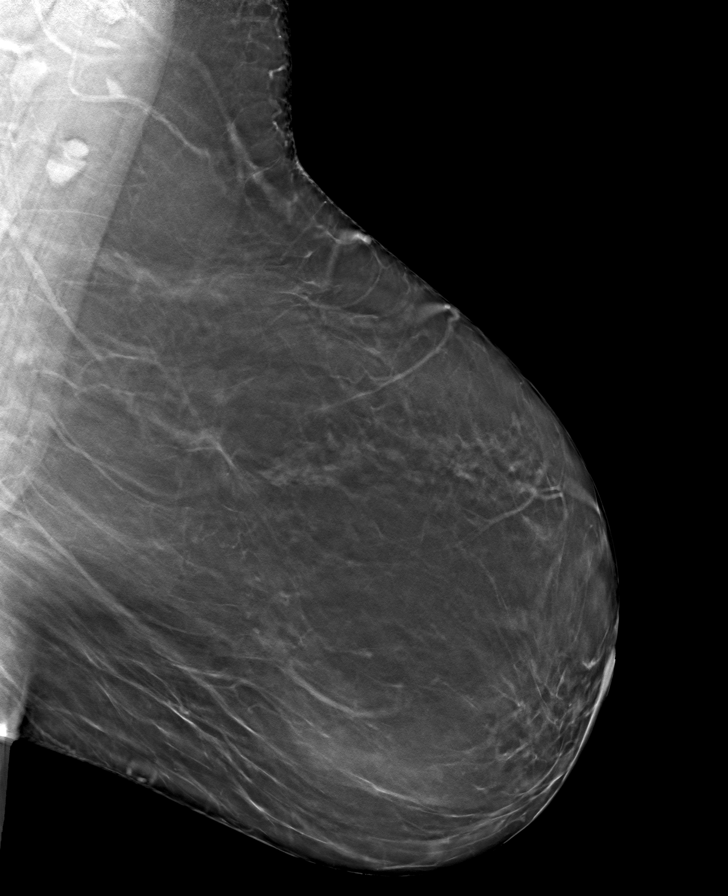

[L CC tomo · tomo slice 37/73.0]
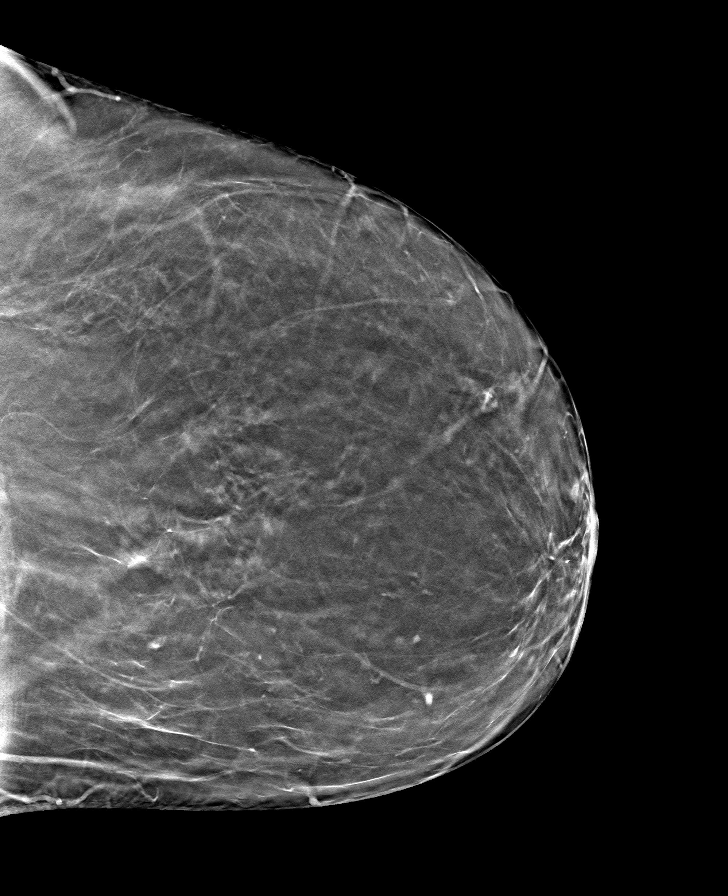

[R CC tomo · tomo slice 38/75.0]
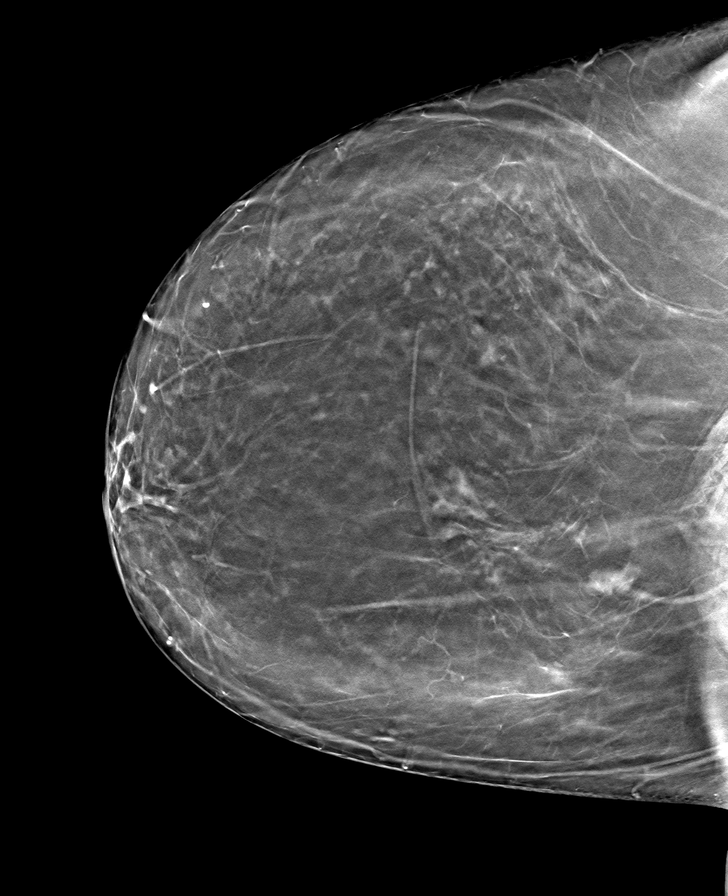

[8 of 24 positions shown; findings below may reference images not displayed]

ACR Breast Density Category b: There are scattered areas of
fibroglandular density.
FINDINGS: There are no findings suspicious for malignancy. Images were
processed with CAD.
IMPRESSION: No mammographic evidence of malignancy. A result letter of this
screening mammogram will be mailed directly to the patient.

RECOMMENDATION:
Screening mammogram in one year. (Code:[id number])

BI-RADS CATEGORY  1: Negative.

## 2021-01-14 NOTE — H&P (Signed)
TOTAL KNEE ADMISSION H&P  Patient is being admitted for right total knee arthroplasty.  Subjective:  Chief Complaint: Right knee pain.  HPI: Donna Padilla, 65 y.o. female has a history of pain and functional disability in the right knee due to arthritis and has failed non-surgical conservative treatments for greater than 12 weeks to include corticosteriod injections and activity modification. Onset of symptoms was gradual, starting several years ago with gradually worsening course since that time. The patient noted no past surgery on the right knee.  Patient currently rates pain in the right knee at 7 out of 10 with activity. Patient has worsening of pain with activity and weight bearing, pain that interferes with activities of daily living, pain with passive range of motion and crepitus. Patient has evidence of bone-on-bone arthritis in the lateral and patellofemoral compartments of the right knee by imaging studies. There is no active infection.  Patient Active Problem List   Diagnosis Date Noted  . HSV-1 infection   . Goiter     Past Medical History:  Diagnosis Date  . Goiter   . HSV-1 infection   . Simple endometrial hyperplasia without atypia 09/2007   recommended f/u bx in one year/Biopsy in 12/2008 was benign.    Past Surgical History:  Procedure Laterality Date  . APPENDECTOMY  1970  . BREAST BIOPSY  1972   Lump removed-benign  . BREAST EXCISIONAL BIOPSY Right    benign  . DILATION AND CURETTAGE OF UTERUS    . HYSTEROSCOPY  09/2001   with D&C, Polyp  . THYROIDECTOMY  2006   benign  . TMJ ARTHROPLASTY  1994    Prior to Admission medications   Medication Sig Start Date End Date Taking? Authorizing Provider  diclofenac Sodium (VOLTAREN) 1 % GEL Apply 1 application topically at bedtime.   Yes [provider]  ibuprofen (ADVIL) 800 MG tablet Take 800 mg by mouth every 8 (eight) hours as needed for moderate pain.   Yes [provider]  levothyroxine  (SYNTHROID) 137 MCG tablet Take 137 mcg by mouth every morning.   Yes [provider]  Pseudoephedrine-Ibuprofen (ADVIL COLD/SINUS) 30-200 MG TABS Take 1 tablet by mouth daily as needed (sinus congestion).   Yes [provider]  valACYclovir (VALTREX) 500 MG tablet Take 1 tablet (500 mg total) by mouth 2 (two) times daily. Patient not taking: Reported on 01/08/2021 04/29/16   Fontaine, Nadyne Coombes, MD    No Known Allergies  Social History   Socioeconomic History  . Marital status: Married    Spouse name: Not on file  . Number of children: Not on file  . Years of education: Not on file  . Highest education level: Not on file  Occupational History  . Not on file  Tobacco Use  . Smoking status: Former Games developer  . Smokeless tobacco: Never Used  Vaping Use  . Vaping Use: Never used  Substance and Sexual Activity  . Alcohol use: Not Currently    Alcohol/week: 0.0 standard drinks  . Drug use: No  . Sexual activity: Yes    Birth control/protection: Other-see comments, Post-menopausal    Comment: vasectomy-1st intercourse 70 yo-1 partner  Other Topics Concern  . Not on file  Social History Narrative  . Not on file   Social Determinants of Health   Financial Resource Strain: Not on file  Food Insecurity: Not on file  Transportation Needs: Not on file  Physical Activity: Not on file  Stress: Not on file  Social  Connections: Not on file  Intimate Partner Violence: Not on file    Tobacco Use: Medium Risk  . Smoking Tobacco Use: Former Smoker  . Smokeless Tobacco Use: Never Used   Social History   Substance and Sexual Activity  Alcohol Use Not Currently  . Alcohol/week: 0.0 standard drinks    Family History  Problem Relation Age of Onset  . Cancer Father 77       esophageal  . Diabetes Brother   . Diabetes Maternal Grandfather   . Colon cancer Maternal Grandmother   . Atrial fibrillation Mother     Review of Systems  Constitutional: Negative for  chills and fever.  HENT: Negative for congestion, sore throat and tinnitus.   Eyes: Negative for double vision, photophobia and pain.  Respiratory: Negative for cough, shortness of breath and wheezing.   Cardiovascular: Negative for chest pain, palpitations and orthopnea.  Gastrointestinal: Negative for heartburn, nausea and vomiting.  Genitourinary: Negative for dysuria, frequency and urgency.  Musculoskeletal: Positive for joint pain.  Neurological: Negative for dizziness, weakness and headaches.    Objective:  Physical Exam: Well nourished and well developed.  General: Alert and oriented x3, cooperative and pleasant, no acute distress.  Head: normocephalic, atraumatic, neck supple.  Eyes: EOMI.  Respiratory: breath sounds clear in all fields, no wheezing, rales, or rhonchi. Cardiovascular: Regular rate and rhythm, no murmurs, gallops or rubs.  Abdomen: non-tender to palpation and soft, normoactive bowel sounds. Musculoskeletal:  Right Knee Exam:  No effusion.  Valgus deformity.  Range of motion is 5-115 degrees.  Moderate crepitus on range of motion of the knee.  Positive lateral, greater than medial, joint line tenderness.  Stable knee.   Calves soft and nontender. Motor function intact in LE. Strength 5/5 LE bilaterally. Neuro: Distal pulses 2+. Sensation to light touch intact in LE.  Imaging Review Plain radiographs demonstrate severe degenerative joint disease of the right knee. The overall alignment is mild valgus. The bone quality appears to be adequate for age and reported activity level.  Assessment/Plan:  End stage arthritis, right knee   The patient history, physical examination, clinical judgment of the provider and imaging studies are consistent with end stage degenerative joint disease of the right knee and total knee arthroplasty is deemed medically necessary. The treatment options including medical management, injection therapy arthroscopy and arthroplasty  were discussed at length. The risks and benefits of total knee arthroplasty were presented and reviewed. The risks due to aseptic loosening, infection, stiffness, patella tracking problems, thromboembolic complications and other imponderables were discussed. The patient acknowledged the explanation, agreed to proceed with the plan and consent was signed. Patient is being admitted for inpatient treatment for surgery, pain control, PT, OT, prophylactic antibiotics, VTE prophylaxis, progressive ambulation and ADLs and discharge planning. The patient is planning to be discharged home.   Patient's anticipated LOS is less than 2 midnights, meeting these requirements: - Younger than 39 - Lives within 1 hour of care - Has a competent adult at home to recover with post-op recover - NO history of  - Chronic pain requiring opiods  - Diabetes  - Coronary Artery Disease  - Heart failure  - Heart attack  - Stroke  - DVT/VTE  - Cardiac arrhythmia  - Respiratory Failure/COPD  - Renal failure  - Anemia  - Advanced Liver disease  Therapy Plans: Outpatient therapy at [facility] Disposition: Home with daughter-in-law Planned DVT Prophylaxis: Aspirin 325 mg BID DME Needed: None PCP: Marrian Salvage, PA-C  TXA: IV Allergies: NKDA Anesthesia Concerns: None BMI: 32 Last HgbA1c: Not diabetic [facility] - [place])  Other: - Taking clearance form by [facility] office (preop appt 3/3)  - Patient was instructed on what medications to stop prior to surgery. - Follow-up visit in 2 weeks with Dr. Lequita Halt - Begin physical therapy following surgery - Pre-operative lab work as pre-surgical testing - Prescriptions will be provided in hospital at time of discharge  Arther Abbott, PA-C Orthopedic Surgery [facility] [place]

## 2021-01-20 NOTE — Progress Notes (Signed)
MORNING OF SURGERY DRINK:   DRINK 1 G2 drink BEFORE YOU LEAVE HOME, DRINK ALL OF THE  G2 DRINK AT ONE TIME.   NO SOLID FOOD AFTER 600 PM THE NIGHT BEFORE YOUR SURGERY. YOU MAY DRINK CLEAR FLUIDS. THE G2 DRINK YOU DRINK BEFORE YOU LEAVE HOME WILL BE THE LAST FLUIDS YOU DRINK BEFORE SURGERY.  PAIN IS EXPECTED AFTER SURGERY AND WILL NOT BE COMPLETELY ELIMINATED. AMBULATION AND TYLENOL WILL HELP REDUCE INCISIONAL AND GAS PAIN. MOVEMENT IS KEY!  YOU ARE EXPECTED TO BE OUT OF BED WITHIN 4 HOURS OF ADMISSION TO YOUR PATIENT ROOM.  SITTING IN THE RECLINER THROUGHOUT THE DAY IS IMPORTANT FOR DRINKING FLUIDS AND MOVING GAS THROUGHOUT THE GI TRACT.  COMPRESSION STOCKINGS SHOULD BE WORN [facility] STAY UNLESS YOU ARE WALKING.   INCENTIVE SPIROMETER SHOULD BE USED EVERY HOUR WHILE AWAKE TO DECREASE POST-OPERATIVE COMPLICATIONS SUCH AS PNEUMONIA.  WHEN DISCHARGED HOME, IT IS IMPORTANT TO CONTINUE TO WALK EVERY HOUR AND USE THE INCENTIVE SPIROMETER EVERY HOUR.        DUE TO COVID-19 ONLY ONE VISITOR IS ALLOWED TO COME WITH YOU AND STAY IN THE WAITING ROOM ONLY DURING PRE OP AND PROCEDURE DAY OF SURGERY. THE 1 VISITOR  MAY VISIT WITH YOU AFTER SURGERY IN YOUR PRIVATE ROOM DURING VISITING HOURS ONLY!  YOU NEED TO HAVE A COVID 19 TEST ON__3/24/2022 ____ @_______ , THIS TEST MUST BE DONE BEFORE SURGERY,  COVID TESTING SITE [address] [place] , IT IS ON THE RIGHT GOING OUT [address] APPROXIMATELY  2 MINUTES PAST [employer] ON THE RIGHT. ONCE YOUR COVID TEST IS COMPLETED,  PLEASE BEGIN THE QUARANTINE INSTRUCTIONS AS OUTLINED IN YOUR HANDOUT.                Donna Padilla  01/20/2021   Your procedure is scheduled on:        01/26/2021   Report to [facility] Main  Entrance   Report to admitting at     1010am     Call this number if you have problems the morning of surgery [phone]    REMEMBER: NO  SOLID FOOD CANDY OR GUM AFTER MIDNIGHT.  CLEAR LIQUIDS UNTIL    0940am        . NOTHING BY MOUTH EXCEPT CLEAR LIQUIDS UNTIL     0940 am PLEASE FINISH ENSURE DRINK PER SURGEON ORDER  WHICH NEEDS TO BE COMPLETED AT    0940am   .      CLEAR LIQUID DIET   Foods Allowed                                                                    Coffee and tea, regular and decaf                            Fruit ices (not with fruit pulp)                                      Iced Popsicles  Carbonated beverages, regular and diet                                    Cranberry, grape and apple juices Sports drinks like Gatorade Lightly seasoned clear broth or consume(fat free) Sugar, honey syrup ___________________________________________________________________      BRUSH YOUR TEETH MORNING OF SURGERY AND RINSE YOUR MOUTH OUT, NO CHEWING GUM CANDY OR MINTS.     Take these medicines the morning of surgery with A SIP OF WATER: Synthroid  DO NOT TAKE ANY DIABETIC MEDICATIONS DAY OF YOUR SURGERY                               You may not have any metal on your body including hair pins and              piercings  Do not wear jewelry, make-up, lotions, powders or perfumes, deodorant             Do not wear nail polish on your fingernails.  Do not shave  48 hours prior to surgery.              Men may shave face and neck.   Do not bring valuables to the hospital. [facility] IS NOT             RESPONSIBLE   FOR VALUABLES.  Contacts, dentures or bridgework may not be worn into surgery.  Leave suitcase in the car. After surgery it may be brought to your room.     Patients discharged the day of surgery will not be allowed to drive home. IF YOU ARE HAVING SURGERY AND GOING HOME THE SAME DAY, YOU MUST HAVE AN ADULT TO DRIVE YOU HOME AND BE WITH YOU FOR 24 HOURS. YOU MAY GO HOME BY TAXI OR UBER OR ORTHERWISE, BUT AN ADULT MUST ACCOMPANY YOU HOME AND STAY WITH YOU FOR 24 HOURS.  Name and phone number of your  driver:  Special Instructions: N/A              Please read over the following fact sheets you were given: _____________________________________________________________________  [facility] - Preparing for Surgery Before surgery, you can play an important role.  Because skin is not sterile, your skin needs to be as free of germs as possible.  You can reduce the number of germs on your skin by washing with CHG (chlorahexidine gluconate) soap before surgery.  CHG is an antiseptic cleaner which kills germs and bonds with the skin to continue killing germs even after washing. Please DO NOT use if you have an allergy to CHG or antibacterial soaps.  If your skin becomes reddened/irritated stop using the CHG and inform your nurse when you arrive at Short Stay. Do not shave (including legs and underarms) for at least 48 hours prior to the first CHG shower.  You may shave your face/neck. Please follow these instructions carefully:  1.  Shower with CHG Soap the night before surgery and the  morning of Surgery.  2.  If you choose to wash your hair, wash your hair first as usual with your  normal  shampoo.  3.  After you shampoo, rinse your hair and body thoroughly to remove the  shampoo.  4.  Use CHG as you would any other liquid soap.  You can apply chg directly  to the skin and wash                       Gently with a scrungie or clean washcloth.  5.  Apply the CHG Soap to your body ONLY FROM THE NECK DOWN.   Do not use on face/ open                           Wound or open sores. Avoid contact with eyes, ears mouth and genitals (private parts).                       Wash face,  Genitals (private parts) with your normal soap.             6.  Wash thoroughly, paying special attention to the area where your surgery  will be performed.  7.  Thoroughly rinse your body with warm water from the neck down.  8.  DO NOT shower/wash with your normal soap after using and rinsing off  the CHG  Soap.                9.  Pat yourself dry with a clean towel.            10.  Wear clean pajamas.            11.  Place clean sheets on your bed the night of your first shower and do not  sleep with pets. Day of Surgery : Do not apply any lotions/deodorants the morning of surgery.  Please wear clean clothes to the hospital/surgery center.  FAILURE TO FOLLOW THESE INSTRUCTIONS MAY RESULT IN THE CANCELLATION OF YOUR SURGERY PATIENT SIGNATURE_________________________________  NURSE SIGNATURE__________________________________  ________________________________________________________________________

## 2021-01-22 ENCOUNTER — Encounter (HOSPITAL_COMMUNITY)
Admission: RE | Admit: 2021-01-22 | Discharge: 2021-01-22 | Disposition: A | Discharge status: Home / Self Care | Payer: Medicare Other | Source: Ambulatory Visit | Attending: Orthopedic Surgery | Admitting: Orthopedic Surgery

## 2021-01-26 ENCOUNTER — Ambulatory Visit (HOSPITAL_COMMUNITY): Admission: RE | Admit: 2021-01-26 | Payer: Medicare Other | Source: Home / Self Care | Admitting: Orthopedic Surgery

## 2021-01-26 ENCOUNTER — Encounter (HOSPITAL_COMMUNITY): Admission: RE | Payer: Self-pay | Source: Home / Self Care

## 2021-01-26 SURGERY — ARTHROPLASTY, KNEE, TOTAL
Anesthesia: Choice | Site: Knee | Laterality: Right

## 2021-02-10 ENCOUNTER — Encounter (HOSPITAL_COMMUNITY): Payer: Medicare Other

## 2021-02-16 NOTE — Progress Notes (Signed)
Pt. Needs orders for upcomming surgery.PAT and labs on 02/17/21.Thanks.

## 2021-02-16 NOTE — Patient Instructions (Addendum)
DUE TO COVID-19 ONLY ONE VISITOR IS ALLOWED TO COME WITH YOU AND STAY IN THE WAITING ROOM ONLY DURING PRE OP AND PROCEDURE DAY OF SURGERY. THE 1 VISITOR  MAY VISIT WITH YOU AFTER SURGERY IN YOUR PRIVATE ROOM DURING VISITING HOURS ONLY!  YOU NEED TO HAVE A COVID 19 TEST ON: 02/20/21 @ 12:00 PM , THIS TEST MUST BE DONE BEFORE SURGERY,  COVID TESTING SITE [address] [place] [address], IT IS ON THE RIGHT GOING OUT [address] APPROXIMATELY  2 MINUTES PAST [employer] ON THE RIGHT. ONCE YOUR COVID TEST IS COMPLETED,  PLEASE BEGIN THE QUARANTINE INSTRUCTIONS AS OUTLINED IN YOUR HANDOUT.                Donna Padilla   Your procedure is scheduled on: 02/23/21   Report to [facility] Main  Entrance   Report to admitting at: 10:10 AM     Call this number if you have problems the morning of surgery [phone]    Remember: Do not eat solid food :After Midnight. Clear liquids diet until: 9:40 am.   CLEAR LIQUID DIET  Foods Allowed                                                                     Foods Excluded  Coffee and tea, regular and decaf                             liquids that you cannot  Plain Jell-O any favor except red or purple                                           see through such as: Fruit ices (not with fruit pulp)                                     milk, soups, orange juice  Iced Popsicles                                    All solid food Carbonated beverages, regular and diet                                    Cranberry, grape and apple juices Sports drinks like Gatorade Lightly seasoned clear broth or consume(fat free) Sugar, honey syrup  Sample Menu Breakfast                                Lunch                                     Supper Cranberry juice  Beef broth                            Chicken broth Jell-O                                     Grape juice                           Apple juice Coffee or tea                         Jell-O                                      Popsicle                                                Coffee or tea                        Coffee or tea  _____________________________________________________________________  BRUSH YOUR TEETH MORNING OF SURGERY AND RINSE YOUR MOUTH OUT, NO CHEWING GUM CANDY OR MINTS.    Take these medicines the morning of surgery with A SIP OF WATER: levothyroxine.  DO NOT TAKE ANY DIABETIC MEDICATIONS DAY OF YOUR SURGERY                               You may not have any metal on your body including hair pins and              piercings  Do not wear jewelry, make-up, lotions, powders or perfumes, deodorant             Do not wear nail polish on your fingernails.  Do not shave  48 hours prior to surgery.    Do not bring valuables to the hospital. [facility] IS NOT             RESPONSIBLE   FOR VALUABLES.  Contacts, dentures or bridgework may not be worn into surgery.  Leave suitcase in the car. After surgery it may be brought to your room.     Patients discharged the day of surgery will not be allowed to drive home. IF YOU ARE HAVING SURGERY AND GOING HOME THE SAME DAY, YOU MUST HAVE AN ADULT TO DRIVE YOU HOME AND BE WITH YOU FOR 24 HOURS. YOU MAY GO HOME BY TAXI OR UBER OR ORTHERWISE, BUT AN ADULT MUST ACCOMPANY YOU HOME AND STAY WITH YOU FOR 24 HOURS.  Name and phone number of your driver:  Special Instructions: N/A              Please read over the following fact sheets you were given: _____________________________________________________________________         [facility] - Preparing for Surgery Before surgery, you can play an important role.  Because skin is not sterile, your skin needs to be as free of germs as possible.  You can reduce the number of germs on your skin by washing with CHG (  chlorahexidine gluconate) soap before surgery.  CHG is an antiseptic cleaner which kills germs and bonds with the skin to continue killing  germs even after washing. Please DO NOT use if you have an allergy to CHG or antibacterial soaps.  If your skin becomes reddened/irritated stop using the CHG and inform your nurse when you arrive at Short Stay. Do not shave (including legs and underarms) for at least 48 hours prior to the first CHG shower.  You may shave your face/neck. Please follow these instructions carefully:  1.  Shower with CHG Soap the night before surgery and the  morning of Surgery.  2.  If you choose to wash your hair, wash your hair first as usual with your  normal  shampoo.  3.  After you shampoo, rinse your hair and body thoroughly to remove the  shampoo.                           4.  Use CHG as you would any other liquid soap.  You can apply chg directly  to the skin and wash                       Gently with a scrungie or clean washcloth.  5.  Apply the CHG Soap to your body ONLY FROM THE NECK DOWN.   Do not use on face/ open                           Wound or open sores. Avoid contact with eyes, ears mouth and genitals (private parts).                       Wash face,  Genitals (private parts) with your normal soap.             6.  Wash thoroughly, paying special attention to the area where your surgery  will be performed.  7.  Thoroughly rinse your body with warm water from the neck down.  8.  DO NOT shower/wash with your normal soap after using and rinsing off  the CHG Soap.                9.  Pat yourself dry with a clean towel.            10.  Wear clean pajamas.            11.  Place clean sheets on your bed the night of your first shower and do not  sleep with pets. Day of Surgery : Do not apply any lotions/deodorants the morning of surgery.  Please wear clean clothes to the hospital/surgery center.  FAILURE TO FOLLOW THESE INSTRUCTIONS MAY RESULT IN THE CANCELLATION OF YOUR SURGERY PATIENT SIGNATURE_________________________________  NURSE  SIGNATURE__________________________________  ________________________________________________________________________

## 2021-02-17 ENCOUNTER — Encounter (HOSPITAL_COMMUNITY)
Admission: RE | Admit: 2021-02-17 | Discharge: 2021-02-17 | Disposition: A | Discharge status: Home / Self Care | Payer: Medicare Other | Source: Ambulatory Visit | Attending: Orthopedic Surgery | Admitting: Orthopedic Surgery

## 2021-02-17 ENCOUNTER — Encounter (HOSPITAL_COMMUNITY): Payer: Self-pay

## 2021-02-17 ENCOUNTER — Other Ambulatory Visit: Payer: Self-pay

## 2021-02-17 DIAGNOSIS — Z01812 Encounter for preprocedural laboratory examination: Secondary | ICD-10-CM | POA: Diagnosis not present

## 2021-02-17 HISTORY — DX: Other complications of anesthesia, initial encounter: T88.59XA

## 2021-02-17 HISTORY — DX: Nausea with vomiting, unspecified: R11.2

## 2021-02-17 HISTORY — DX: Other specified postprocedural states: Z98.890

## 2021-02-17 HISTORY — DX: Hypothyroidism, unspecified: E03.9

## 2021-02-17 HISTORY — DX: Unspecified osteoarthritis, unspecified site: M19.90

## 2021-02-17 LAB — CBC
HCT: 47.9 % — ABNORMAL HIGH (ref 36.0–46.0)
Hemoglobin: 15.7 g/dL — ABNORMAL HIGH (ref 12.0–15.0)
MCH: 28.4 pg (ref 26.0–34.0)
MCHC: 32.8 g/dL (ref 30.0–36.0)
MCV: 86.8 fL (ref 80.0–100.0)
Platelets: 296 10*3/uL (ref 150–400)
RBC: 5.52 MIL/uL — ABNORMAL HIGH (ref 3.87–5.11)
RDW: 13.2 % (ref 11.5–15.5)
WBC: 6.4 10*3/uL (ref 4.0–10.5)
nRBC: 0 % (ref 0.0–0.2)

## 2021-02-17 LAB — SURGICAL PCR SCREEN
MRSA, PCR: NEGATIVE
Staphylococcus aureus: POSITIVE — AB

## 2021-02-17 NOTE — Progress Notes (Signed)
COVID Vaccine Completed: Yes Date COVID Vaccine completed: 10/19/20 COVID vaccine manufacturer:  Moderna    PCP - Marrian Salvage: PA-C Cardiologist -   Chest x-ray -  EKG -  Stress Test -  ECHO -  Cardiac Cath -  Pacemaker/ICD device last checked:  Sleep Study -  CPAP -   Fasting Blood Sugar -  Checks Blood Sugar _____ times a day  Blood Thinner Instructions: Aspirin Instructions: Last Dose:  Anesthesia review:   Patient denies shortness of breath, fever, cough and chest pain at PAT appointment   Patient verbalized understanding of instructions that were given to them at the PAT appointment. Patient was also instructed that they will need to review over the PAT instructions again at home before surgery.

## 2021-02-17 NOTE — H&P (Signed)
TOTAL KNEE ADMISSION H&P  Patient is being admitted for right total knee arthroplasty.  Subjective:  Chief Complaint: Right knee pain.  HPI: Donna Padilla, 65 y.o. female has a history of pain and functional disability in the right knee due to arthritis and has failed non-surgical conservative treatments for greater than 12 weeks to include corticosteriod injections and activity modification. Onset of symptoms was gradual, starting >10 years ago with gradually worsening course since that time. The patient noted no past surgery on the right knee.  Patient currently rates pain in the right knee at 8 out of 10 with activity. Patient has worsening of pain with activity and weight bearing, pain that interferes with activities of daily living and pain with passive range of motion. Patient has evidence of bone-on-bone arthritis in the lateral and patellofemoral compartments of the right knee by imaging studies.There is no active infection.  Patient Active Problem List   Diagnosis Date Noted  . HSV-1 infection   . Goiter     Past Medical History:  Diagnosis Date  . Arthritis   . Complication of anesthesia   . Goiter   . HSV-1 infection   . Hypothyroidism   . PONV (postoperative nausea and vomiting)   . Simple endometrial hyperplasia without atypia 09/2007   recommended f/u bx in one year/Biopsy in 12/2008 was benign.    Past Surgical History:  Procedure Laterality Date  . APPENDECTOMY  1970  . BREAST BIOPSY  1972   Lump removed-benign  . BREAST EXCISIONAL BIOPSY Right    benign  . DILATION AND CURETTAGE OF UTERUS    . HYSTEROSCOPY  09/2001   with D&C, Polyp  . THYROIDECTOMY  2006   benign  . TMJ ARTHROPLASTY  1994    Prior to Admission medications   Medication Sig Start Date End Date Taking? Authorizing Provider  diclofenac Sodium (VOLTAREN) 1 % GEL Apply 1 application topically at bedtime.   Yes [provider]  levothyroxine (SYNTHROID) 137 MCG tablet Take 137 mcg by  mouth every morning.   Yes [provider]  meloxicam (MOBIC) 15 MG tablet Take 15 mg by mouth daily.   Yes [provider]  Pseudoephedrine-Ibuprofen (ADVIL COLD/SINUS) 30-200 MG TABS Take 1 tablet by mouth daily as needed (sinus congestion).   Yes [provider]  ibuprofen (ADVIL) 800 MG tablet Take 800 mg by mouth every 8 (eight) hours as needed for moderate pain.    [provider]    No Known Allergies  Social History   Socioeconomic History  . Marital status: Married    Spouse name: Not on file  . Number of children: Not on file  . Years of education: Not on file  . Highest education level: Not on file  Occupational History  . Not on file  Tobacco Use  . Smoking status: Former Games developer  . Smokeless tobacco: Never Used  Vaping Use  . Vaping Use: Never used  Substance and Sexual Activity  . Alcohol use: Not Currently    Alcohol/week: 0.0 standard drinks  . Drug use: No  . Sexual activity: Yes    Birth control/protection: Other-see comments, Post-menopausal    Comment: vasectomy-1st intercourse 58 yo-1 partner  Other Topics Concern  . Not on file  Social History Narrative  . Not on file   Social Determinants of Health   Financial Resource Strain: Not on file  Food Insecurity: Not on file  Transportation Needs: Not on file  Physical Activity: Not on file  Stress: Not on file  Social Connections: Not on file  Intimate Partner Violence: Not on file    Tobacco Use: Medium Risk  . Smoking Tobacco Use: Former Smoker  . Smokeless Tobacco Use: Never Used   Social History   Substance and Sexual Activity  Alcohol Use Not Currently  . Alcohol/week: 0.0 standard drinks    Family History  Problem Relation Age of Onset  . Cancer Father 62       esophageal  . Diabetes Brother   . Diabetes Maternal Grandfather   . Colon cancer Maternal Grandmother   . Atrial fibrillation Mother     Review of Systems  Constitutional: Negative  for chills and fever.  HENT: Negative for congestion, sore throat and tinnitus.   Eyes: Negative for double vision, photophobia and pain.  Respiratory: Negative for cough, shortness of breath and wheezing.   Cardiovascular: Negative for chest pain, palpitations and orthopnea.  Gastrointestinal: Negative for heartburn, nausea and vomiting.  Genitourinary: Negative for dysuria, frequency and urgency.  Musculoskeletal: Positive for joint pain.  Neurological: Negative for dizziness, weakness and headaches.    Objective:  Physical Exam: Well nourished and well developed.  General: Alert and oriented x3, cooperative and pleasant, no acute distress.  Head: normocephalic, atraumatic, neck supple.  Eyes: EOMI.  Respiratory: breath sounds clear in all fields, no wheezing, rales, or rhonchi. Cardiovascular: Regular rate and rhythm, no murmurs, gallops or rubs.  Abdomen: non-tender to palpation and soft, normoactive bowel sounds. Musculoskeletal:  Right Knee Exam:  No effusion.  Valgus deformity.  Range of motion is 5-115 degrees.  Moderate crepitus on range of motion of the knee.  Positive lateral, greater than medial, joint line tenderness.  Stable knee.   Calves soft and nontender. Motor function intact in LE. Strength 5/5 LE bilaterally. Neuro: Distal pulses 2+. Sensation to light touch intact in LE.  Vital signs in last 24 hours: Temp:  [98 F (36.7 C)] 98 F (36.7 C) (04/19 0911) Pulse Rate:  [73] 73 (04/19 0911) BP: (132)/(94) 132/94 (04/19 0911) SpO2:  [97 %] 97 % (04/19 0911)  Imaging Review Plain radiographs demonstrate severe degenerative joint disease of the right knee. The overall alignment is neutral. The bone quality appears to be adequate for age and reported activity level.  Assessment/Plan:  End stage arthritis, right knee   The patient history, physical examination, clinical judgment of the provider and imaging studies are consistent with end stage degenerative  joint disease of the right knee and total knee arthroplasty is deemed medically necessary. The treatment options including medical management, injection therapy arthroscopy and arthroplasty were discussed at length. The risks and benefits of total knee arthroplasty were presented and reviewed. The risks due to aseptic loosening, infection, stiffness, patella tracking problems, thromboembolic complications and other imponderables were discussed. The patient acknowledged the explanation, agreed to proceed with the plan and consent was signed. Patient is being admitted for inpatient treatment for surgery, pain control, PT, OT, prophylactic antibiotics, VTE prophylaxis, progressive ambulation and ADLs and discharge planning. The patient is planning to be discharged home.   Patient's anticipated LOS is less than 2 midnights, meeting these requirements: - Younger than 85 - Lives within 1 hour of care - Has a competent adult at home to recover with post-op recover - NO history of  - Chronic pain requiring opiods  - Diabetes  - Coronary Artery Disease  - Heart failure  - Heart attack  - Stroke  - DVT/VTE  - Cardiac arrhythmia  -  Respiratory Failure/COPD  - Renal failure  - Anemia  - Advanced Liver disease  Therapy Plans: Outpatient therapy at [facility] Disposition: Home with daughter-in-law Planned DVT Prophylaxis: Aspirin 325 mg BID DME Needed: None PCP: Marrian Salvage, PA-C (clearance received) TXA: IV Allergies: NKDA Anesthesia Concerns: None BMI: 32 Last HgbA1c: Not diabetic [facility]: [facility] ([address] - [place])  - Patient was instructed on what medications to stop prior to surgery. - Follow-up visit in 2 weeks with Dr. Lequita Halt - Begin physical therapy following surgery - Pre-operative lab work as pre-surgical testing - Prescriptions will be provided in hospital at time of discharge  Arther Abbott, PA-C Orthopedic Surgery [facility] [place]  [facility]

## 2021-02-17 NOTE — Progress Notes (Signed)
PCR: POSITIVE STAPH. 

## 2021-02-19 ENCOUNTER — Other Ambulatory Visit (HOSPITAL_COMMUNITY): Payer: Medicare Other

## 2021-02-20 ENCOUNTER — Other Ambulatory Visit (HOSPITAL_COMMUNITY)
Admission: RE | Admit: 2021-02-20 | Discharge: 2021-02-20 | Disposition: A | Discharge status: Home / Self Care | Payer: Medicare Other | Source: Ambulatory Visit | Attending: Orthopedic Surgery | Admitting: Orthopedic Surgery

## 2021-02-20 DIAGNOSIS — Z20822 Contact with and (suspected) exposure to covid-19: Secondary | ICD-10-CM | POA: Insufficient documentation

## 2021-02-20 DIAGNOSIS — Z01812 Encounter for preprocedural laboratory examination: Secondary | ICD-10-CM | POA: Diagnosis present

## 2021-02-20 LAB — SARS CORONAVIRUS 2 (TAT 6-24 HRS): SARS Coronavirus 2: NEGATIVE

## 2021-02-22 MED ORDER — BUPIVACAINE LIPOSOME 1.3 % IJ SUSP
20.0000 mL | Freq: Once | INTRAMUSCULAR | Status: DC
  Filled 2021-02-22: qty 20

## 2021-02-23 ENCOUNTER — Ambulatory Visit (HOSPITAL_COMMUNITY): Payer: Medicare Other | Admitting: Anesthesiology

## 2021-02-23 ENCOUNTER — Encounter (HOSPITAL_COMMUNITY)
Admission: RE | Disposition: A | Discharge status: Home / Self Care | Payer: Self-pay | Source: Home / Self Care | Attending: Orthopedic Surgery

## 2021-02-23 ENCOUNTER — Observation Stay (HOSPITAL_COMMUNITY)
Admission: RE | Admit: 2021-02-23 | Discharge: 2021-02-24 | Disposition: A | Discharge status: Home / Self Care | Payer: Medicare Other | Attending: Orthopedic Surgery | Admitting: Orthopedic Surgery

## 2021-02-23 ENCOUNTER — Encounter (HOSPITAL_COMMUNITY): Payer: Self-pay | Admitting: Orthopedic Surgery

## 2021-02-23 ENCOUNTER — Other Ambulatory Visit: Payer: Self-pay

## 2021-02-23 DIAGNOSIS — E039 Hypothyroidism, unspecified: Secondary | ICD-10-CM | POA: Diagnosis not present

## 2021-02-23 DIAGNOSIS — M1711 Unilateral primary osteoarthritis, right knee: Principal | ICD-10-CM | POA: Diagnosis present

## 2021-02-23 DIAGNOSIS — M1710 Unilateral primary osteoarthritis, unspecified knee: Secondary | ICD-10-CM | POA: Diagnosis present

## 2021-02-23 DIAGNOSIS — M179 Osteoarthritis of knee, unspecified: Secondary | ICD-10-CM | POA: Diagnosis present

## 2021-02-23 DIAGNOSIS — Z79899 Other long term (current) drug therapy: Secondary | ICD-10-CM | POA: Insufficient documentation

## 2021-02-23 DIAGNOSIS — Z87891 Personal history of nicotine dependence: Secondary | ICD-10-CM | POA: Insufficient documentation

## 2021-02-23 HISTORY — PX: TOTAL KNEE ARTHROPLASTY: SHX125

## 2021-02-23 LAB — COMPREHENSIVE METABOLIC PANEL
ALT: 21 U/L (ref 0–44)
AST: 17 U/L (ref 15–41)
Albumin: 3.6 g/dL (ref 3.5–5.0)
Alkaline Phosphatase: 79 U/L (ref 38–126)
Anion gap: 8 (ref 5–15)
BUN: 16 mg/dL (ref 8–23)
CO2: 27 mmol/L (ref 22–32)
Calcium: 9.3 mg/dL (ref 8.9–10.3)
Chloride: 107 mmol/L (ref 98–111)
Creatinine, Ser: 0.82 mg/dL (ref 0.44–1.00)
GFR, Estimated: >60 mL/min (ref >60)
Glucose, Bld: 97 mg/dL (ref 70–99)
Potassium: 4.7 mmol/L (ref 3.5–5.1)
Sodium: 142 mmol/L (ref 135–145)
Total Bilirubin: 0.3 mg/dL (ref 0.3–1.2)
Total Protein: 6.6 g/dL (ref 6.5–8.1)

## 2021-02-23 LAB — TYPE AND SCREEN
ABO/RH(D): B POS
Antibody Screen: NEGATIVE

## 2021-02-23 LAB — PROTIME-INR
INR: 1 (ref 0.8–1.2)
Prothrombin Time: 13.3 seconds (ref 11.4–15.2)

## 2021-02-23 LAB — ABO/RH: ABO/RH(D): B POS

## 2021-02-23 SURGERY — ARTHROPLASTY, KNEE, TOTAL
Anesthesia: Spinal | Site: Knee | Laterality: Right

## 2021-02-23 MED ORDER — PROPOFOL 500 MG/50ML IV EMUL
INTRAVENOUS | Status: DC | PRN
  Administered 2021-02-23: 70 ug/kg/min via INTRAVENOUS
  Administered 2021-02-23: 30 mg via INTRAVENOUS

## 2021-02-23 MED ORDER — ONDANSETRON HCL 4 MG/2ML IJ SOLN
INTRAMUSCULAR | Status: DC | PRN
  Administered 2021-02-23: 4 mg via INTRAVENOUS

## 2021-02-23 MED ORDER — DIPHENHYDRAMINE HCL 12.5 MG/5ML PO ELIX: 12.5000 mg | ORAL_SOLUTION | ORAL | Status: DC | PRN

## 2021-02-23 MED ORDER — CHLORHEXIDINE GLUCONATE 0.12 % MT SOLN
15.0000 mL | Freq: Once | OROMUCOSAL | Status: AC
  Administered 2021-02-23: 15 mL via OROMUCOSAL

## 2021-02-23 MED ORDER — POVIDONE-IODINE 10 % EX SWAB
2.0000 "application " | Freq: Once | CUTANEOUS | Status: AC
  Administered 2021-02-23: 2 via TOPICAL

## 2021-02-23 MED ORDER — ORAL CARE MOUTH RINSE: 15.0000 mL | Freq: Once | OROMUCOSAL | Status: AC

## 2021-02-23 MED ORDER — POLYETHYLENE GLYCOL 3350 17 G PO PACK: 17.0000 g | PACK | Freq: Every day | ORAL | Status: DC | PRN

## 2021-02-23 MED ORDER — BISACODYL 10 MG RE SUPP
10.0000 mg | Freq: Every day | RECTAL | Status: DC | PRN
Start: 2021-02-23 — End: 2021-02-24

## 2021-02-23 MED ORDER — BUPIVACAINE IN DEXTROSE 0.75-8.25 % IT SOLN
INTRATHECAL | Status: DC | PRN
  Administered 2021-02-23: 1.6 mL via INTRATHECAL

## 2021-02-23 MED ORDER — OXYCODONE HCL 5 MG/5ML PO SOLN: 5.0000 mg | Freq: Once | ORAL | Status: DC | PRN

## 2021-02-23 MED ORDER — TRANEXAMIC ACID-NACL 1000-0.7 MG/100ML-% IV SOLN
1000.0000 mg | INTRAVENOUS | Status: AC
  Administered 2021-02-23: 1000 mg via INTRAVENOUS
  Filled 2021-02-23: qty 100

## 2021-02-23 MED ORDER — METHOCARBAMOL 500 MG IVPB - SIMPLE MED
500.0000 mg | Freq: Four times a day (QID) | INTRAVENOUS | Status: DC | PRN
  Filled 2021-02-23: qty 50

## 2021-02-23 MED ORDER — METOCLOPRAMIDE HCL 5 MG PO TABS: 5.0000 mg | ORAL_TABLET | Freq: Three times a day (TID) | ORAL | Status: DC | PRN

## 2021-02-23 MED ORDER — ACETAMINOPHEN 10 MG/ML IV SOLN
1000.0000 mg | Freq: Four times a day (QID) | INTRAVENOUS | Status: DC
  Administered 2021-02-23: 1000 mg via INTRAVENOUS
  Filled 2021-02-23: qty 100

## 2021-02-23 MED ORDER — HYDROMORPHONE HCL 1 MG/ML IJ SOLN: 0.2500 mg | INTRAMUSCULAR | Status: DC | PRN

## 2021-02-23 MED ORDER — GABAPENTIN 300 MG PO CAPS
300.0000 mg | ORAL_CAPSULE | Freq: Three times a day (TID) | ORAL | Status: DC
  Administered 2021-02-23 – 2021-02-24 (×3): 300 mg via ORAL
  Filled 2021-02-23 (×3): qty 1

## 2021-02-23 MED ORDER — ONDANSETRON HCL 4 MG/2ML IJ SOLN: 4.0000 mg | Freq: Once | INTRAMUSCULAR | Status: DC | PRN

## 2021-02-23 MED ORDER — ONDANSETRON HCL 4 MG/2ML IJ SOLN
4.0000 mg | Freq: Four times a day (QID) | INTRAMUSCULAR | Status: DC | PRN
  Administered 2021-02-23: 4 mg via INTRAVENOUS
  Filled 2021-02-23: qty 2

## 2021-02-23 MED ORDER — FLEET ENEMA 7-19 GM/118ML RE ENEM: 1.0000 | ENEMA | Freq: Once | RECTAL | Status: DC | PRN

## 2021-02-23 MED ORDER — MORPHINE SULFATE (PF) 4 MG/ML IV SOLN
0.5000 mg | INTRAVENOUS | Status: DC | PRN
  Administered 2021-02-23: 1 mg via INTRAVENOUS
  Filled 2021-02-23: qty 1

## 2021-02-23 MED ORDER — LACTATED RINGERS IV SOLN: INTRAVENOUS | Status: DC

## 2021-02-23 MED ORDER — SODIUM CHLORIDE (PF) 0.9 % IJ SOLN
INTRAMUSCULAR | Status: DC | PRN
  Administered 2021-02-23: 50 mL

## 2021-02-23 MED ORDER — LEVOTHYROXINE SODIUM 137 MCG PO TABS
137.0000 ug | ORAL_TABLET | Freq: Every day | ORAL | Status: DC
  Administered 2021-02-24: 137 ug via ORAL
  Filled 2021-02-23: qty 1

## 2021-02-23 MED ORDER — ONDANSETRON HCL 4 MG PO TABS: 4.0000 mg | ORAL_TABLET | Freq: Four times a day (QID) | ORAL | Status: DC | PRN

## 2021-02-23 MED ORDER — CEFAZOLIN SODIUM-DEXTROSE 2-4 GM/100ML-% IV SOLN
2.0000 g | Freq: Four times a day (QID) | INTRAVENOUS | Status: AC
  Administered 2021-02-23 – 2021-02-24 (×2): 2 g via INTRAVENOUS
  Filled 2021-02-23 (×2): qty 100

## 2021-02-23 MED ORDER — BUPIVACAINE LIPOSOME 1.3 % IJ SUSP
INTRAMUSCULAR | Status: DC | PRN
  Administered 2021-02-23: 20 mL

## 2021-02-23 MED ORDER — BUPIVACAINE HCL (PF) 0.5 % IJ SOLN
INTRAMUSCULAR | Status: DC | PRN
  Administered 2021-02-23: 20 mL via PERINEURAL

## 2021-02-23 MED ORDER — METHOCARBAMOL 500 MG PO TABS
500.0000 mg | ORAL_TABLET | Freq: Four times a day (QID) | ORAL | Status: DC | PRN
  Administered 2021-02-23 – 2021-02-24 (×3): 500 mg via ORAL
  Filled 2021-02-23 (×3): qty 1

## 2021-02-23 MED ORDER — MIDAZOLAM HCL 2 MG/2ML IJ SOLN
1.0000 mg | INTRAMUSCULAR | Status: DC
  Administered 2021-02-23: 2 mg via INTRAVENOUS
  Filled 2021-02-23: qty 2

## 2021-02-23 MED ORDER — SODIUM CHLORIDE 0.9 % IV SOLN: INTRAVENOUS | Status: DC

## 2021-02-23 MED ORDER — DEXAMETHASONE SODIUM PHOSPHATE 10 MG/ML IJ SOLN: 8.0000 mg | Freq: Once | INTRAMUSCULAR | Status: DC

## 2021-02-23 MED ORDER — LIDOCAINE 2% (20 MG/ML) 5 ML SYRINGE
INTRAMUSCULAR | Status: DC | PRN
  Administered 2021-02-23: 60 mg via INTRAVENOUS

## 2021-02-23 MED ORDER — OXYCODONE HCL 5 MG PO TABS
5.0000 mg | ORAL_TABLET | ORAL | Status: DC | PRN
  Administered 2021-02-23 – 2021-02-24 (×5): 10 mg via ORAL
  Filled 2021-02-23 (×5): qty 2

## 2021-02-23 MED ORDER — DEXAMETHASONE SODIUM PHOSPHATE 10 MG/ML IJ SOLN
INTRAMUSCULAR | Status: DC | PRN
  Administered 2021-02-23: 10 mg via INTRAVENOUS

## 2021-02-23 MED ORDER — FENTANYL CITRATE (PF) 100 MCG/2ML IJ SOLN
50.0000 ug | INTRAMUSCULAR | Status: DC
  Administered 2021-02-23: 50 ug via INTRAVENOUS
  Filled 2021-02-23: qty 2

## 2021-02-23 MED ORDER — OXYCODONE HCL 5 MG PO TABS: 5.0000 mg | ORAL_TABLET | Freq: Once | ORAL | Status: DC | PRN

## 2021-02-23 MED ORDER — SODIUM CHLORIDE 0.9 % IR SOLN
Status: DC | PRN
  Administered 2021-02-23: 1000 mL

## 2021-02-23 MED ORDER — DOCUSATE SODIUM 100 MG PO CAPS
100.0000 mg | ORAL_CAPSULE | Freq: Two times a day (BID) | ORAL | Status: DC
  Administered 2021-02-23 – 2021-02-24 (×2): 100 mg via ORAL
  Filled 2021-02-23 (×2): qty 1

## 2021-02-23 MED ORDER — MENTHOL 3 MG MT LOZG: 1.0000 | LOZENGE | OROMUCOSAL | Status: DC | PRN

## 2021-02-23 MED ORDER — ACETAMINOPHEN 500 MG PO TABS
1000.0000 mg | ORAL_TABLET | Freq: Four times a day (QID) | ORAL | Status: DC
  Administered 2021-02-23 – 2021-02-24 (×3): 1000 mg via ORAL
  Filled 2021-02-23 (×3): qty 2

## 2021-02-23 MED ORDER — TRAMADOL HCL 50 MG PO TABS: 50.0000 mg | ORAL_TABLET | Freq: Four times a day (QID) | ORAL | Status: DC | PRN

## 2021-02-23 MED ORDER — PHENOL 1.4 % MT LIQD: 1.0000 | OROMUCOSAL | Status: DC | PRN

## 2021-02-23 MED ORDER — CHLORHEXIDINE GLUCONATE CLOTH 2 % EX PADS
6.0000 | MEDICATED_PAD | Freq: Every day | CUTANEOUS | Status: DC
  Administered 2021-02-24: 6 via TOPICAL

## 2021-02-23 MED ORDER — METOCLOPRAMIDE HCL 5 MG/ML IJ SOLN
5.0000 mg | Freq: Three times a day (TID) | INTRAMUSCULAR | Status: DC | PRN
Start: 2021-02-23 — End: 2021-02-24

## 2021-02-23 MED ORDER — CEFAZOLIN SODIUM-DEXTROSE 2-4 GM/100ML-% IV SOLN
2.0000 g | INTRAVENOUS | Status: AC
  Administered 2021-02-23: 2 g via INTRAVENOUS
  Filled 2021-02-23: qty 100

## 2021-02-23 MED ORDER — DEXAMETHASONE SODIUM PHOSPHATE 10 MG/ML IJ SOLN
10.0000 mg | Freq: Once | INTRAMUSCULAR | Status: AC
  Administered 2021-02-24: 10 mg via INTRAVENOUS
  Filled 2021-02-23: qty 1

## 2021-02-23 MED ORDER — ASPIRIN EC 325 MG PO TBEC
325.0000 mg | DELAYED_RELEASE_TABLET | Freq: Two times a day (BID) | ORAL | Status: DC
  Administered 2021-02-24: 325 mg via ORAL
  Filled 2021-02-23: qty 1

## 2021-02-23 SURGICAL SUPPLY — 54 items
ATTUNE MED DOME PAT 38 KNEE (Knees) ×1 IMPLANT
ATTUNE PS FEM RT SZ 7 CEM KNEE (Femur) ×1 IMPLANT
ATTUNE PSRP INSR SZ7 8 KNEE (Insert) ×1 IMPLANT
BASE TIBIA ATTUNE KNEE SYS SZ6 (Knees) IMPLANT
BLADE SAG 18X100X1.27 (BLADE) ×2 IMPLANT
BLADE SAW SGTL 11.0X1.19X90.0M (BLADE) ×2 IMPLANT
BNDG CMPR MED 10X6 ELC LF (GAUZE/BANDAGES/DRESSINGS) ×1
BNDG ELASTIC 6X10 VLCR STRL LF (GAUZE/BANDAGES/DRESSINGS) ×1 IMPLANT
BOWL SMART MIX CTS (DISPOSABLE) ×2 IMPLANT
BSPLAT TIB 6 CMNT ROT PLAT STR (Knees) ×1 IMPLANT
CEMENT HV SMART SET (Cement) ×4 IMPLANT
COVER SURGICAL LIGHT HANDLE (MISCELLANEOUS) ×2 IMPLANT
CUFF TOURN SGL QUICK 34 (TOURNIQUET CUFF) ×2
CUFF TRNQT CYL 34X4.125X (TOURNIQUET CUFF) ×1 IMPLANT
DECANTER SPIKE VIAL GLASS SM (MISCELLANEOUS) ×2 IMPLANT
DRAPE U-SHAPE 47X51 STRL (DRAPES) ×2 IMPLANT
DRESSING AQUACEL AG SP 3.5X10 (GAUZE/BANDAGES/DRESSINGS) IMPLANT
DRSG AQUACEL AG SP 3.5X10 (GAUZE/BANDAGES/DRESSINGS) ×2
DURAPREP 26ML APPLICATOR (WOUND CARE) ×2 IMPLANT
ELECT REM PT RETURN 15FT ADLT (MISCELLANEOUS) ×2 IMPLANT
GLOVE SRG 8 PF TXTR STRL LF DI (GLOVE) ×1 IMPLANT
GLOVE SURG ENC MOIS LTX SZ6.5 (GLOVE) ×2 IMPLANT
GLOVE SURG ENC MOIS LTX SZ8 (GLOVE) ×4 IMPLANT
GLOVE SURG UNDER POLY LF SZ7 (GLOVE) ×2 IMPLANT
GLOVE SURG UNDER POLY LF SZ8 (GLOVE) ×2
GLOVE SURG UNDER POLY LF SZ8.5 (GLOVE) ×2 IMPLANT
GOWN STRL REUS W/TWL LRG LVL3 (GOWN DISPOSABLE) ×4 IMPLANT
GOWN STRL REUS W/TWL XL LVL3 (GOWN DISPOSABLE) ×2 IMPLANT
HANDPIECE INTERPULSE COAX TIP (DISPOSABLE) ×2
HOLDER FOLEY CATH W/STRAP (MISCELLANEOUS) IMPLANT
IMMOBILIZER KNEE 20 (SOFTGOODS) ×2
IMMOBILIZER KNEE 20 THIGH 36 (SOFTGOODS) IMPLANT
KIT TURNOVER KIT A (KITS) ×2 IMPLANT
MANIFOLD NEPTUNE II (INSTRUMENTS) ×2 IMPLANT
NS IRRIG 1000ML POUR BTL (IV SOLUTION) ×2 IMPLANT
PACK TOTAL KNEE CUSTOM (KITS) ×2 IMPLANT
PADDING CAST SYN 6 (CAST SUPPLIES) ×1
PADDING CAST SYNTHETIC 6X4 NS (CAST SUPPLIES) IMPLANT
PENCIL SMOKE EVACUATOR (MISCELLANEOUS) ×2 IMPLANT
PIN DRILL FIX HALF THREAD (BIT) ×1 IMPLANT
PIN STEINMAN FIXATION KNEE (PIN) ×1 IMPLANT
PROTECTOR NERVE ULNAR (MISCELLANEOUS) ×2 IMPLANT
SET HNDPC FAN SPRY TIP SCT (DISPOSABLE) ×1 IMPLANT
SUT MNCRL AB 4-0 PS2 18 (SUTURE) ×2 IMPLANT
SUT STRATAFIX 0 PDS 27 VIOLET (SUTURE) ×2
SUT VIC AB 2-0 CT1 27 (SUTURE) ×6
SUT VIC AB 2-0 CT1 TAPERPNT 27 (SUTURE) ×3 IMPLANT
SUTURE STRATFX 0 PDS 27 VIOLET (SUTURE) ×1 IMPLANT
TAPE STRIPS DRAPE STRL (GAUZE/BANDAGES/DRESSINGS) ×1 IMPLANT
TIBIA ATTUNE KNEE SYS BASE SZ6 (Knees) ×2 IMPLANT
TRAY FOLEY MTR SLVR 16FR STAT (SET/KITS/TRAYS/PACK) ×2 IMPLANT
TUBE SUCTION HIGH CAP CLEAR NV (SUCTIONS) ×2 IMPLANT
WATER STERILE IRR 1000ML POUR (IV SOLUTION) ×4 IMPLANT
WRAP KNEE MAXI GEL POST OP (GAUZE/BANDAGES/DRESSINGS) ×1 IMPLANT

## 2021-02-23 NOTE — Evaluation (Signed)
Physical Therapy Evaluation Patient Details Name: Donna Padilla MRN: [id number] DOB: 1956-01-24 Today's Date: 02/23/2021   History of Present Illness  Patient is 65 y.o. female s/p Rt TKA on 02/23/21 with PMH significant for hypothyroidism, OA, TMJ surgery.  Clinical Impression  Donna Padilla is a 65 y.o. female POD 0 s/p Rt TKA. Patient reports independence with mobility at baseline. Patient is now limited by functional impairments (see PT problem list below) and requires min assist for transfers with RW. Patient was able to take several small steps at EOB with RW and min assist. Patient was limited by pain, nausea, dizziness, and overheated sensation in standing and returned to supine. Patient instructed in exercise to facilitate circulation to manage edema and reduce risk of DVT. Patient will benefit from continued skilled PT interventions to address impairments and progress towards PLOF. Acute PT will follow to progress mobility and stair training in preparation for safe discharge home.     Follow Up Recommendations Follow surgeon's recommendation for DC plan and follow-up therapies;Outpatient PT    Equipment Recommendations  Rolling walker with 5" wheels    Recommendations for Other Services       Precautions / Restrictions Precautions Precautions: Fall Restrictions Weight Bearing Restrictions: No Other Position/Activity Restrictions: WBAT      Mobility  Bed Mobility Overal bed mobility: Needs Assistance Bed Mobility: Supine to Sit;Sit to Supine     Supine to sit: HOB elevated;Min guard Sit to supine: Min assist;HOB elevated   General bed mobility comments: pt required cues and extra time to pivot and bring Rt LE off EOB. Min assist required to return to supine due to increaed Rt knee pain, nausea, and dizziness.    Transfers Overall transfer level: Needs assistance Equipment used: Rolling walker (2 wheeled) Transfers: Sit to/from Stand Sit to Stand: Min assist          General transfer comment: Cues for hand placement for power up. Pt initiated rise but required min assist to steady.  Ambulation/Gait Ambulation/Gait assistance: Min assist Gait Distance (Feet): 2 Feet Assistive device: Rolling walker (2 wheeled) Gait Pattern/deviations: Step-to pattern;Decreased stride length;Decreased step length - left;Decreased step length - right;Decreased weight shift to right Gait velocity: decr   General Gait Details: Cues for safe side step pattern. Min assist to mange walker and block Rt knee to prevent buckling. Cues for use of UE's to reduce pressure and decrease Rt knee pain. pt shuffling Lt foot rather than stepping. Returned to sit EOB due to incresaed nausea/overheated sensation.  Stairs            Wheelchair Mobility    Modified Rankin (Stroke Patients Only)       Balance Overall balance assessment: Needs assistance Sitting-balance support: Feet supported Sitting balance-Leahy Scale: Good     Standing balance support: During functional activity;Bilateral upper extremity supported Standing balance-Leahy Scale: Poor                               Pertinent Vitals/Pain Pain Assessment: Faces Faces Pain Scale: Hurts even more Pain Location: Rt knee Pain Descriptors / Indicators: Aching;Discomfort;Grimacing;Guarding Pain Intervention(s): Limited activity within patient's tolerance;Monitored during session;Premedicated before session;Repositioned;Ice applied    Home Living Family/patient expects to be discharged to:: Private residence Living Arrangements: Spouse/significant other;Children Available Help at Discharge: Family Type of Home: House Home Access: Stairs to enter Entrance Stairs-Rails: Can reach both Entrance Stairs-Number of Steps: 5 Home Layout: Two level;Full bath on  main level;Bed/bath upstairs Home Equipment: None      Prior Function Level of Independence: Independent               Hand  Dominance   Dominant Hand: Right    Extremity/Trunk Assessment   Upper Extremity Assessment Upper Extremity Assessment: Overall WFL for tasks assessed    Lower Extremity Assessment Lower Extremity Assessment: RLE deficits/detail RLE Deficits / Details: good quad activation, no extensor lag with SLR, pt limited by pain. RLE Sensation: WNL RLE Coordination: WNL    Cervical / Trunk Assessment Cervical / Trunk Assessment: Normal  Communication   Communication: No difficulties  Cognition Arousal/Alertness: Awake/alert Behavior During Therapy: WFL for tasks assessed/performed Overall Cognitive Status: Within Functional Limits for tasks assessed                                        General Comments      Exercises Total Joint Exercises Ankle Circles/Pumps: AROM;Both;10 reps;Supine   Assessment/Plan    PT Assessment Patient needs continued PT services  PT Problem List Decreased strength;Decreased range of motion;Decreased activity tolerance;Decreased balance;Decreased mobility;Decreased knowledge of use of DME;Decreased knowledge of precautions;Pain       PT Treatment Interventions DME instruction;Gait training;Stair training;Functional mobility training;Therapeutic exercise;Therapeutic activities;Balance training;Patient/family education    PT Goals (Current goals can be found in the Care Plan section)  Acute Rehab PT Goals Patient Stated Goal: recover and be able to walk/exercise more. PT Goal Formulation: With patient Time For Goal Achievement: 03/02/21 Potential to Achieve Goals: Good    Frequency 7X/week   Barriers to discharge        Co-evaluation               AM-PAC PT "6 Clicks" Mobility  Outcome Measure Help needed turning from your back to your side while in a flat bed without using bedrails?: A Little Help needed moving from lying on your back to sitting on the side of a flat bed without using bedrails?: A Little Help needed  moving to and from a bed to a chair (including a wheelchair)?: A Little Help needed standing up from a chair using your arms (e.g., wheelchair or bedside chair)?: A Little Help needed to walk in hospital room?: A Lot Help needed climbing 3-5 steps with a railing? : A Lot 6 Click Score: 16    End of Session Equipment Utilized During Treatment: Gait belt Activity Tolerance: Patient limited by pain;Other (comment) (limited by nausea) Patient left: in bed;with call bell/phone within reach;with bed alarm set;with nursing/sitter in room;with SCD's reapplied Nurse Communication: Mobility status PT Visit Diagnosis: Muscle weakness (generalized) (M62.81);Difficulty in walking, not elsewhere classified (R26.2)    Time: 8453-6468 PT Time Calculation (min) (ACUTE ONLY): 33 min   Charges:   PT Evaluation $PT Eval Low Complexity: 1 Low PT Treatments $Therapeutic Activity: 8-22 mins        Wynn Maudlin, DPT Acute Rehabilitation Services Office [phone] Pager [phone]    Anitra Lauth 02/23/2021, 6:54 PM

## 2021-02-23 NOTE — Interval H&P Note (Signed)
History and Physical Interval Note:  02/23/2021 10:38 AM  Donna Padilla  has presented today for surgery, with the diagnosis of right knee osteoarthritis.  The various methods of treatment have been discussed with the patient and family. After consideration of risks, benefits and other options for treatment, the patient has consented to  Procedure(s): TOTAL KNEE ARTHROPLASTY (Right) as a surgical intervention.  The patient's history has been reviewed, patient examined, no change in status, stable for surgery.  I have reviewed the patient's chart and labs.  Questions were answered to the patient's satisfaction.     [name]

## 2021-02-23 NOTE — Transfer of Care (Signed)
Immediate Anesthesia Transfer of Care Note  Patient: Donna Padilla  Procedure(s) Performed: Procedure(s): TOTAL KNEE ARTHROPLASTY (Right)  Patient Location: PACU  Anesthesia Type:Spinal  Level of Consciousness: awake, alert  and oriented  Airway & Oxygen Therapy: Patient Spontanous Breathing  Post-op Assessment: Report given to RN and Post -op Vital signs reviewed and stable  Post vital signs: Reviewed and stable  Last Vitals:  Vitals:   02/23/21 1211 02/23/21 1226  BP: 134/83 140/89  Pulse: 72 75  Resp: 13 20  Temp:    SpO2: 97% 98%    Complications: No apparent anesthesia complications

## 2021-02-23 NOTE — Op Note (Signed)
OPERATIVE REPORT-TOTAL KNEE ARTHROPLASTY   Pre-operative diagnosis- Osteoarthritis  Right knee(s)  Post-operative diagnosis- Osteoarthritis Right knee(s)  Procedure-  Right  Total Knee Arthroplasty  Surgeon- Gus Rankin. Julen Rubert, MD  Assistant- Leilani Able, PA-C   Anesthesia-  Adductor canal block and spinal  EBL-25 ml   Drains None  Tourniquet time-  Total Tourniquet Time Documented: Calf (Right) - 41 minutes Total: Calf (Right) - 41 minutes     Complications- None  Condition-PACU - hemodynamically stable.   Brief Clinical Note  Donna Padilla is a 65 y.o. year old female with end stage OA of her right knee with progressively worsening pain and dysfunction. She has constant pain, with activity and at rest and significant functional deficits with difficulties even with ADLs. She has had extensive non-op management including analgesics, injections of cortisone and viscosupplements, and home exercise program, but remains in significant pain with significant dysfunction.Radiographs show bone on bone arthritis lateral and patellofemoral. She presents now for right Total Knee Arthroplasty.    Procedure in detail---   The patient is brought into the operating room and positioned supine on the operating table. After successful administration of  Adductor canal block and spinal,   a tourniquet is placed high on the  Right thigh(s) and the lower extremity is prepped and draped in the usual sterile fashion. Time out is performed by the operating team and then the  Right lower extremity is wrapped in Esmarch, knee flexed and the tourniquet inflated to 300 mmHg.       A midline incision is made with a ten blade through the subcutaneous tissue to the level of the extensor mechanism. A fresh blade is used to make a medial parapatellar arthrotomy. Soft tissue over the proximal medial tibia is subperiosteally elevated to the joint line with a knife and into the semimembranosus bursa with a Cobb  elevator. Soft tissue over the proximal lateral tibia is elevated with attention being paid to avoiding the patellar tendon on the tibial tubercle. The patella is everted, knee flexed 90 degrees and the ACL and PCL are removed. Findings are bone on bone lateral and patellofemoral with large global osteophytes.        The drill is used to create a starting hole in the distal femur and the canal is thoroughly irrigated with sterile saline to remove the fatty contents. The 5 degree Right  valgus alignment guide is placed into the femoral canal and the distal femoral cutting block is pinned to remove 9 mm off the distal femur. Resection is made with an oscillating saw.      The tibia is subluxed forward and the menisci are removed. The extramedullary alignment guide is placed referencing proximally at the medial aspect of the tibial tubercle and distally along the second metatarsal axis and tibial crest. The block is pinned to remove 36mm off the more deficient lateral  side. Resection is made with an oscillating saw. Size 6is the most appropriate size for the tibia and the proximal tibia is prepared with the modular drill and keel punch for that size.      The femoral sizing guide is placed and size 7 is most appropriate. Rotation is marked off the epicondylar axis and confirmed by creating a rectangular flexion gap at 90 degrees. The size 7 cutting block is pinned in this rotation and the anterior, posterior and chamfer cuts are made with the oscillating saw. The intercondylar block is then placed and that cut is made.  Trial size 6 tibial component, trial size 7 posterior stabilized femur and a 8  mm posterior stabilized rotating platform insert trial is placed. Full extension is achieved with excellent varus/valgus and anterior/posterior balance throughout full range of motion. The patella is everted and thickness measured to be 22  mm. Free hand resection is taken to 12 mm, a 38 template is placed, lug holes  are drilled, trial patella is placed, and it tracks normally. Osteophytes are removed off the posterior femur with the trial in place. All trials are removed and the cut bone surfaces prepared with pulsatile lavage. Cement is mixed and once ready for implantation, the size 6 tibial implant, size  7 posterior stabilized femoral component, and the size 38 patella are cemented in place and the patella is held with the clamp. The trial insert is placed and the knee held in full extension. The Exparel (20 ml mixed with 60 ml saline) is injected into the extensor mechanism, posterior capsule, medial and lateral gutters and subcutaneous tissues.  All extruded cement is removed and once the cement is hard the permanent 8 mm posterior stabilized rotating platform insert is placed into the tibial tray.      The wound is copiously irrigated with saline solution and the extensor mechanism closed with # 0 Stratofix suture. The tourniquet is released for a total tourniquet time of 41  minutes. Flexion against gravity is 140 degrees and the patella tracks normally. Subcutaneous tissue is closed with 2.0 vicryl and subcuticular with running 4.0 Monocryl. The incision is cleaned and dried and steri-strips and a bulky sterile dressing are applied. The limb is placed into a knee immobilizer and the patient is awakened and transported to recovery in stable condition.      Please note that a surgical assistant was a medical necessity for this procedure in order to perform it in a safe and expeditious manner. Surgical assistant was necessary to retract the ligaments and vital neurovascular structures to prevent injury to them and also necessary for proper positioning of the limb to allow for anatomic placement of the prosthesis.   Gus Rankin Arrionna Serena, MD    02/23/2021, 1:50 PM

## 2021-02-23 NOTE — Anesthesia Procedure Notes (Signed)
Anesthesia Procedure Image    

## 2021-02-23 NOTE — Anesthesia Procedure Notes (Signed)
Spinal  Patient location during procedure: OR Start time: 02/23/2021 12:48 PM Reason for block: surgical anesthesia Staffing Performed: resident/CRNA  Anesthesiologist: Myrtie Soman, MD Resident/CRNA: Gerald Leitz, CRNA Preanesthetic Checklist Completed: patient identified, IV checked, site marked, risks and benefits discussed, surgical consent, monitors and equipment checked, pre-op evaluation and timeout performed Spinal Block Patient position: sitting Prep: DuraPrep Patient monitoring: heart rate, continuous pulse ox, blood pressure and cardiac monitor Approach: midline Location: L3-4 Injection technique: single-shot Needle Needle type: Introducer and Pencan  Needle gauge: 24 G Needle length: 9 cm Assessment Sensory level: T4 Events: CSF return Additional Notes Negative paresthesia. Negative blood return. Positive free-flowing CSF. Expiration date of kit checked and confirmed. Patient tolerated procedure well, without complications.

## 2021-02-23 NOTE — Anesthesia Postprocedure Evaluation (Signed)
Anesthesia Post Note  Patient: Donna Padilla  Procedure(s) Performed: TOTAL KNEE ARTHROPLASTY (Right Knee)     Patient location during evaluation: PACU Anesthesia Type: Spinal Level of consciousness: oriented and awake and alert Pain management: pain level controlled Vital Signs Assessment: post-procedure vital signs reviewed and stable Respiratory status: spontaneous breathing, respiratory function stable and patient connected to nasal cannula oxygen Cardiovascular status: blood pressure returned to baseline and stable Postop Assessment: no headache, no backache and no apparent nausea or vomiting Anesthetic complications: no   No complications documented.  Last Vitals:  Vitals:   02/23/21 1445 02/23/21 1500  BP:  (!) 130/91  Pulse: 70 65  Resp: 14 17  Temp:    SpO2: 93% 97%    Last Pain:  Vitals:   02/23/21 1500  TempSrc:   PainSc: 0-No pain                 [name] S

## 2021-02-23 NOTE — Progress Notes (Signed)
Assisted Dr. Rose with right, ultrasound guided, adductor canal block. Side rails up, monitors on throughout procedure. See vital signs in flow sheet. Tolerated Procedure well.  

## 2021-02-23 NOTE — Anesthesia Preprocedure Evaluation (Signed)
Anesthesia Evaluation  Patient identified by MRN, date of birth, ID band Patient awake    Reviewed: Allergy & Precautions, NPO status , Patient's Chart, lab work & pertinent test results  History of Anesthesia Complications (+) PONV  Airway Mallampati: II  TM Distance: >3 FB Neck ROM: Full    Dental no notable dental hx.    Pulmonary neg pulmonary ROS, former smoker,    Pulmonary exam normal breath sounds clear to auscultation       Cardiovascular negative cardio ROS Normal cardiovascular exam Rhythm:Regular Rate:Normal     Neuro/Psych negative neurological ROS  negative psych ROS   GI/Hepatic negative GI ROS, Neg liver ROS,   Endo/Other  Hypothyroidism   Renal/GU negative Renal ROS  negative genitourinary   Musculoskeletal  (+) Arthritis , Osteoarthritis,    Abdominal   Peds negative pediatric ROS (+)  Hematology negative hematology ROS (+)   Anesthesia Other Findings   Reproductive/Obstetrics negative OB ROS                             Anesthesia Physical Anesthesia Plan  ASA: II  Anesthesia Plan: Spinal   Post-op Pain Management:  Regional for Post-op pain   Induction: Intravenous  PONV Risk Score and Plan: 3 and Ondansetron, Dexamethasone, Propofol infusion, Treatment may vary due to age or medical condition and Midazolam  Airway Management Planned: Simple Face Mask  Additional Equipment:   Intra-op Plan:   Post-operative Plan:   Informed Consent: I have reviewed the patients History and Physical, chart, labs and discussed the procedure including the risks, benefits and alternatives for the proposed anesthesia with the patient or authorized representative who has indicated his/her understanding and acceptance.     Dental advisory given  Plan Discussed with: CRNA and Surgeon  Anesthesia Plan Comments:         Anesthesia Quick Evaluation

## 2021-02-23 NOTE — Anesthesia Procedure Notes (Signed)
Anesthesia Regional Block: Adductor canal block   Pre-Anesthetic Checklist: ,, timeout performed, Correct Patient, Correct Site, Correct Laterality, Correct Procedure, Correct Position, site marked, Risks and benefits discussed,  Surgical consent,  Pre-op evaluation,  At surgeon's request and post-op pain management  Laterality: Left  Prep: chloraprep       Needles:  Injection technique: Single-shot  Needle Type: Echogenic Needle     Needle Length: 9cm      Additional Needles:   Procedures:,,,, ultrasound used (permanent image in chart),,,,  Narrative:  Start time: 02/23/2021 11:44 AM End time: 02/23/2021 11:49 AM Injection made incrementally with aspirations every 5 mL.  Performed by: Personally  Anesthesiologist: Eilene Ghazi, MD  Additional Notes: Patient tolerated the procedure well without complications

## 2021-02-24 ENCOUNTER — Encounter (HOSPITAL_COMMUNITY): Payer: Self-pay | Admitting: Orthopedic Surgery

## 2021-02-24 DIAGNOSIS — M1711 Unilateral primary osteoarthritis, right knee: Secondary | ICD-10-CM | POA: Diagnosis not present

## 2021-02-24 LAB — BASIC METABOLIC PANEL
Anion gap: 6 (ref 5–15)
BUN: 17 mg/dL (ref 8–23)
CO2: 26 mmol/L (ref 22–32)
Calcium: 8.7 mg/dL — ABNORMAL LOW (ref 8.9–10.3)
Chloride: 103 mmol/L (ref 98–111)
Creatinine, Ser: 0.65 mg/dL (ref 0.44–1.00)
GFR, Estimated: >60 mL/min (ref >60)
Glucose, Bld: 152 mg/dL — ABNORMAL HIGH (ref 70–99)
Potassium: 4.2 mmol/L (ref 3.5–5.1)
Sodium: 135 mmol/L (ref 135–145)

## 2021-02-24 LAB — CBC
HCT: 38.1 % (ref 36.0–46.0)
Hemoglobin: 12.4 g/dL (ref 12.0–15.0)
MCH: 28.3 pg (ref 26.0–34.0)
MCHC: 32.5 g/dL (ref 30.0–36.0)
MCV: 87 fL (ref 80.0–100.0)
Platelets: 280 10*3/uL (ref 150–400)
RBC: 4.38 MIL/uL (ref 3.87–5.11)
RDW: 13.2 % (ref 11.5–15.5)
WBC: 10.9 10*3/uL — ABNORMAL HIGH (ref 4.0–10.5)
nRBC: 0 % (ref 0.0–0.2)

## 2021-02-24 MED ORDER — METHOCARBAMOL 500 MG PO TABS: 500.0000 mg | ORAL_TABLET | Freq: Four times a day (QID) | ORAL | 0 refills | Status: DC | PRN

## 2021-02-24 MED ORDER — ASPIRIN 325 MG PO TBEC: 325.0000 mg | DELAYED_RELEASE_TABLET | Freq: Two times a day (BID) | ORAL | 0 refills | Status: AC

## 2021-02-24 MED ORDER — OXYCODONE HCL 5 MG PO TABS: 5.0000 mg | ORAL_TABLET | Freq: Four times a day (QID) | ORAL | 0 refills | Status: DC | PRN

## 2021-02-24 MED ORDER — TRAMADOL HCL 50 MG PO TABS: 50.0000 mg | ORAL_TABLET | Freq: Four times a day (QID) | ORAL | 0 refills | Status: DC | PRN

## 2021-02-24 MED ORDER — GABAPENTIN 300 MG PO CAPS: ORAL_CAPSULE | ORAL | 0 refills | Status: DC

## 2021-02-24 MED ORDER — SODIUM CHLORIDE 0.9 % IV BOLUS
250.0000 mL | Freq: Once | INTRAVENOUS | Status: AC
  Administered 2021-02-24: 250 mL via INTRAVENOUS

## 2021-02-24 NOTE — Progress Notes (Signed)
   02/24/21 1500  PT Visit Information  Last PT Received On 02/24/21  Pt is progressing well; ready for d/c with family support from PT standpoint  Assistance Needed +1  History of Present Illness Patient is 65 y.o. female s/p Rt TKA on 02/23/21 with PMH significant for hypothyroidism, OA, TMJ surgery.  Subjective Data  Patient Stated Goal recover and be able to walk/exercise more.  Precautions  Precautions Fall;Knee  Pain Assessment  Pain Assessment Faces  Faces Pain Scale 2  Pain Location Rt knee  Pain Descriptors / Indicators Aching;Discomfort;Grimacing;Guarding  Pain Intervention(s) Limited activity within patient's tolerance;Monitored during session;Premedicated before session;Repositioned  Cognition  Arousal/Alertness Awake/alert  Behavior During Therapy WFL for tasks assessed/performed  Overall Cognitive Status Within Functional Limits for tasks assessed  Bed Mobility  General bed mobility comments in recliner  Transfers  Equipment used Rolling walker (2 wheeled)  Transfers Sit to/from Stand  Sit to Stand Min guard;Supervision  General transfer comment Cues for hand placement for power up. Pt initiated rise but required min assist to steady.  Ambulation/Gait  Ambulation/Gait assistance Min guard;Supervision  Gait Distance (Feet) 80 Feet  Assistive device Rolling walker (2 wheeled)  Gait Pattern/deviations Step-to pattern;Decreased stride length;Decreased step length - left;Decreased step length - right;Decreased weight shift to right  General Gait Details cue sfor sequence and RW position  Gait velocity decr  Stairs Yes  Stair Management Two rails;Step to pattern;Forwards  Number of Stairs 5 (x2)  General stair comments cues for sequence and technique  Balance  Standing balance support During functional activity;Bilateral upper extremity supported  Standing balance-Leahy Scale Fair (able to stand and wash hands at sink without UE support)  PT - End of Session   Equipment Utilized During Treatment Gait belt  Activity Tolerance Patient tolerated treatment well  Patient left in chair;with call bell/phone within reach;with chair alarm set  Nurse Communication Mobility status   PT - Assessment/Plan  PT Plan Current plan remains appropriate  PT Visit Diagnosis Muscle weakness (generalized) (M62.81);Difficulty in walking, not elsewhere classified (R26.2)  PT Frequency (ACUTE ONLY) 7X/week  Follow Up Recommendations Follow surgeon's recommendation for DC plan and follow-up therapies;Outpatient PT  PT equipment Rolling walker with 5" wheels  AM-PAC PT "6 Clicks" Mobility Outcome Measure (Version 2)  Help needed turning from your back to your side while in a flat bed without using bedrails? 3  Help needed moving from lying on your back to sitting on the side of a flat bed without using bedrails? 3  Help needed moving to and from a bed to a chair (including a wheelchair)? 3  Help needed standing up from a chair using your arms (e.g., wheelchair or bedside chair)? 3  Help needed to walk in hospital room? 3  Help needed climbing 3-5 steps with a railing?  3  6 Click Score 18  Consider Recommendation of Discharge To: Home with [facility]  PT Goal Progression  Progress towards PT goals Progressing toward goals  Acute Rehab PT Goals  PT Goal Formulation With patient  Time For Goal Achievement 03/02/21  Potential to Achieve Goals Good  PT Time Calculation  PT Start Time (ACUTE ONLY) 1446  PT Stop Time (ACUTE ONLY) 1515  PT Time Calculation (min) (ACUTE ONLY) 29 min  PT General Charges  $$ ACUTE PT VISIT 1 Visit  PT Treatments  $Gait Training 23-37 mins

## 2021-02-24 NOTE — Progress Notes (Signed)
Subjective: 1 Day Post-Op Procedure(s) (LRB): TOTAL KNEE ARTHROPLASTY (Right) Patient reports pain as mild.   Patient seen in rounds by Dr. Lequita Halt. Patient is well, and has had no acute complaints or problems other than pain in the right knee. Denies chest pain, SOB, or calf pain. No issues overnight. Foley catheter to be removed.  We will continue therapy today.   Objective: Vital signs in last 24 hours: Temp:  [97.5 F (36.4 C)-98.3 F (36.8 C)] 97.9 F (36.6 C) (04/26 0615) Pulse Rate:  [65-80] 70 (04/26 0615) Resp:  [12-20] 16 (04/26 0615) BP: (99-144)/(61-91) 99/61 (04/26 0615) SpO2:  [90 %-100 %] 90 % (04/26 0615) Weight:  [100.7 kg] 100.7 kg (04/25 1045)  Intake/Output from previous day:  Intake/Output Summary (Last 24 hours) at 02/24/2021 0727 Last data filed at 02/24/2021 0600 Gross per 24 hour  Intake 3433.46 ml  Output 1550 ml  Net 1883.46 ml     Intake/Output this shift: No intake/output data recorded.  Labs: Recent Labs    02/24/21 0322  HGB 12.4   Recent Labs    02/24/21 0322  WBC 10.9*  RBC 4.38  HCT 38.1  PLT 280   Recent Labs    02/23/21 1453 02/24/21 0322  NA 142 135  K 4.7 4.2  CL 107 103  CO2 27 26  BUN 16 17  CREATININE 0.82 0.65  GLUCOSE 97 152*  CALCIUM 9.3 8.7*   Recent Labs    02/23/21 1056  INR 1.0    Exam: General - Patient is Alert and Oriented Extremity - Neurologically intact Neurovascular intact Sensation intact distally Dorsiflexion/Plantar flexion intact Dressing - dressing C/D/I Motor Function - intact, moving foot and toes well on exam.   Past Medical History:  Diagnosis Date  . Arthritis   . Complication of anesthesia   . Goiter   . HSV-1 infection   . Hypothyroidism   . PONV (postoperative nausea and vomiting)   . Simple endometrial hyperplasia without atypia 09/2007   recommended f/u bx in one year/Biopsy in 12/2008 was benign.    Assessment/Plan: 1 Day Post-Op Procedure(s) (LRB): TOTAL  KNEE ARTHROPLASTY (Right) Principal Problem:   OA (osteoarthritis) of knee Active Problems:   Primary osteoarthritis of right knee  Estimated body mass index is 31.85 kg/m as calculated from the following:   Height as of this encounter: 5\' 10"  (1.778 m).   Weight as of this encounter: 100.7 kg. Advance diet Up with therapy D/C IV fluids   Patient's anticipated LOS is less than 2 midnights, meeting these requirements: - Younger than 27 - Lives within 1 hour of care - Has a competent adult at home to recover with post-op recover - NO history of  - Chronic pain requiring opiods  - Diabetes  - Coronary Artery Disease  - Heart failure  - Heart attack  - Stroke  - DVT/VTE  - Cardiac arrhythmia  - Respiratory Failure/COPD  - Renal failure  - Anemia  - Advanced Liver disease  DVT Prophylaxis - Aspirin Weight bearing as tolerated. Continue therapy.  BP soft this AM, 250 mL bolus ordered.  Plan is to go Home after hospital stay. Possible discharge this afternoon pending progress with therapy. Patient will have limited resources at home, so may need an additional day to maximize mobility. Will assess after two sessions, if feeling confident she can go home today.  Scheduled for OPPT Follow-up in the office in 2 weeks  The PDMP database was reviewed today prior  to any opioid medications being prescribed to this patient.  Arther Abbott, PA-C Orthopedic Surgery [phone] 02/24/2021, 7:27 AM

## 2021-02-24 NOTE — TOC Transition Note (Signed)
Transition of Care (TOC) - CM/SW Discharge Note   Patient Details  Name: Donna Padilla MRN: [id number] Date of Birth: 05/17/1956  Transition of Care (TOC) CM/SW Contact:  HOYLE, LUCY, LCSW Phone Number: 02/24/2021, 11:31 AM   Clinical Narrative:    Met with pt and confirmed plans for OPPT at [facility] in [place], [place]  Pt notes she does need a rolling walker - referral placed with [facility].  No further TOC needs.   Final next level of care: OP Rehab Barriers to Discharge: No Barriers Identified   Patient Goals and CMS Choice Patient states their goals for this hospitalization and ongoing recovery are:: return home      Discharge Placement                       Discharge Plan and Services                DME Arranged: Walker rolling DME Agency: [facility] Date DME Agency Contacted: 02/24/21 Time DME Agency Contacted: 0915 Representative spoke with at DME Agency: Nathan            Social Determinants of Health (SDOH) Interventions     Readmission Risk Interventions No flowsheet data found.     

## 2021-02-24 NOTE — Progress Notes (Signed)
Physical Therapy Treatment Patient Details Name: Donna Padilla MRN: [id number] DOB: September 25, 1956 Today's Date: 02/24/2021    History of Present Illness Patient is 65 y.o. female s/p Rt TKA on 02/23/21 with PMH significant for hypothyroidism, OA, TMJ surgery.    PT Comments    Progressing with mobility, will see for second session to review stairs/safety and pt should be abel to d/c later today   Follow Up Recommendations  Follow surgeon's recommendation for DC plan and follow-up therapies;Outpatient PT     Equipment Recommendations  Rolling walker with 5" wheels    Recommendations for Other Services       Precautions / Restrictions Precautions Precautions: Fall Restrictions Weight Bearing Restrictions: No    Mobility  Bed Mobility   Bed Mobility: Supine to Sit     Supine to sit: Supervision     General bed mobility comments: incr time, effortful transition    Transfers Overall transfer level: Needs assistance Equipment used: Rolling walker (2 wheeled) Transfers: Sit to/from Stand Sit to Stand: Min guard;Min assist         General transfer comment: Cues for hand placement for power up. Pt initiated rise but required min assist to steady.  Ambulation/Gait Ambulation/Gait assistance: Min guard Gait Distance (Feet): 80 Feet (10' more) Assistive device: Rolling walker (2 wheeled) Gait Pattern/deviations: Step-to pattern;Decreased stride length;Decreased step length - left;Decreased step length - right;Decreased weight shift to right Gait velocity: decr   General Gait Details: cue sfor sequence and RW position   Stairs             Wheelchair Mobility    Modified Rankin (Stroke Patients Only)       Balance           Standing balance support: During functional activity;Bilateral upper extremity supported Standing balance-Leahy Scale: Poor                              Cognition Arousal/Alertness: Awake/alert Behavior During  Therapy: WFL for tasks assessed/performed Overall Cognitive Status: Within Functional Limits for tasks assessed                                        Exercises Total Joint Exercises Ankle Circles/Pumps: AROM;Both;10 reps;Supine Quad Sets: AROM;Both;10 reps Heel Slides: AROM;10 reps;AAROM;Right Straight Leg Raises: AAROM;Right;10 reps    General Comments        Pertinent Vitals/Pain Pain Assessment: Faces Faces Pain Scale: Hurts a little bit Pain Location: Rt knee Pain Descriptors / Indicators: Aching;Discomfort;Grimacing;Guarding Pain Intervention(s): Limited activity within patient's tolerance;Monitored during session;Premedicated before session;Repositioned    Home Living                      Prior Function            PT Goals (current goals can now be found in the care plan section) Acute Rehab PT Goals Patient Stated Goal: recover and be able to walk/exercise more. PT Goal Formulation: With patient Time For Goal Achievement: 03/02/21 Potential to Achieve Goals: Good Progress towards PT goals: Progressing toward goals    Frequency    7X/week      PT Plan Current plan remains appropriate    Co-evaluation              AM-PAC PT "6 Clicks" Mobility   Outcome Measure  Help  needed turning from your back to your side while in a flat bed without using bedrails?: A Little Help needed moving from lying on your back to sitting on the side of a flat bed without using bedrails?: A Little Help needed moving to and from a bed to a chair (including a wheelchair)?: A Little Help needed standing up from a chair using your arms (e.g., wheelchair or bedside chair)?: A Little Help needed to walk in hospital room?: A Little Help needed climbing 3-5 steps with a railing? : A Little 6 Click Score: 18    End of Session Equipment Utilized During Treatment: Gait belt Activity Tolerance: Patient tolerated treatment well Patient left: in  chair;with call bell/phone within reach;with chair alarm set Nurse Communication: Mobility status PT Visit Diagnosis: Muscle weakness (generalized) (M62.81);Difficulty in walking, not elsewhere classified (R26.2)     Time: 8315-1761 PT Time Calculation (min) (ACUTE ONLY): 27 min  Charges:  $Gait Training: 8-22 mins $Therapeutic Exercise: 8-22 mins                     Delice Bison, PT  Acute Rehab Dept ([facility]) [phone] Pager [phone]  02/24/2021    [facility] 02/24/2021, 12:15 PM

## 2021-03-02 NOTE — Discharge Summary (Signed)
Physician Discharge Summary   Patient ID: Donna Padilla MRN: [id number] DOB/AGE: January 07, 1956 65 y.o.  Admit date: 02/23/2021 Discharge date: 02/24/2021  Primary Diagnosis: Osteoarthritis, right knee   Admission Diagnoses:  Past Medical History:  Diagnosis Date  . Arthritis   . Complication of anesthesia   . Goiter   . HSV-1 infection   . Hypothyroidism   . PONV (postoperative nausea and vomiting)   . Simple endometrial hyperplasia without atypia 09/2007   recommended f/u bx in one year/Biopsy in 12/2008 was benign.   Discharge Diagnoses:   Principal Problem:   OA (osteoarthritis) of knee Active Problems:   Primary osteoarthritis of right knee  Estimated body mass index is 31.85 kg/m as calculated from the following:   Height as of this encounter: 5\' 10"  (1.778 m).   Weight as of this encounter: 100.7 kg.  Procedure:  Procedure(s) (LRB): TOTAL KNEE ARTHROPLASTY (Right)   Consults: None  HPI: Donna Padilla is a 65 y.o. year old female with end stage OA of her right knee with progressively worsening pain and dysfunction. She has constant pain, with activity and at rest and significant functional deficits with difficulties even with ADLs. She has had extensive non-op management including analgesics, injections of cortisone and viscosupplements, and home exercise program, but remains in significant pain with significant dysfunction.Radiographs show bone on bone arthritis lateral and patellofemoral. She presents now for right Total Knee Arthroplasty.    Laboratory Data: Admission on 02/23/2021, Discharged on 02/24/2021  Component Date Value Ref Range Status  . Sodium 02/23/2021 142  135 - 145 mmol/L Final  . Potassium 02/23/2021 4.7  3.5 - 5.1 mmol/L Final  . Chloride 02/23/2021 107  98 - 111 mmol/L Final  . CO2 02/23/2021 27  22 - 32 mmol/L Final  . Glucose, Bld 02/23/2021 97  70 - 99 mg/dL Final   Glucose reference range applies only to samples taken after fasting for at  least 8 hours.  . BUN 02/23/2021 16  8 - 23 mg/dL Final  . Creatinine, Ser 02/23/2021 0.82  0.44 - 1.00 mg/dL Final  . Calcium 02/25/2021 9.3  8.9 - 10.3 mg/dL Final  . Total Protein 02/23/2021 6.6  6.5 - 8.1 g/dL Final  . Albumin 02/25/2021 3.6  3.5 - 5.0 g/dL Final  . AST 87/86/7672 17  15 - 41 U/L Final  . ALT 02/23/2021 21  0 - 44 U/L Final  . Alkaline Phosphatase 02/23/2021 79  38 - 126 U/L Final  . Total Bilirubin 02/23/2021 0.3  0.3 - 1.2 mg/dL Final  . GFR, Estimated 02/23/2021 >60  >60 mL/min Final   Comment: (NOTE) Calculated using the CKD-EPI Creatinine Equation (2021)   . Anion gap 02/23/2021 8  5 - 15 Final   Performed at [facility], [address], [place], [place]  . ABO/RH(D) 02/23/2021 B POS   Final  . Antibody Screen 02/23/2021 NEG   Final  . Sample Expiration 02/23/2021    Final                   Value:02/26/2021,2359 Performed at [facility], [address], [place], [place]   . Prothrombin Time 02/23/2021 13.3  11.4 - 15.2 seconds Final  . INR 02/23/2021 1.0  0.8 - 1.2 Final   Comment: (NOTE) INR goal varies based on device and disease states. Performed at [facility], [address] [place], [place]   . ABO/RH(D) 02/23/2021  Final                   Value:B POS Performed at [facility], [address] [place], [place] [address]   . WBC 02/24/2021 10.9* 4.0 - 10.5 K/uL Final  . RBC 02/24/2021 4.38  3.87 - 5.11 MIL/uL Final  . Hemoglobin 02/24/2021 12.4  12.0 - 15.0 g/dL Final  . HCT 29/56/2130 38.1  36.0 - 46.0 % Final  . MCV 02/24/2021 87.0  80.0 - 100.0 fL Final  . MCH 02/24/2021 28.3  26.0 - 34.0 pg Final  . MCHC 02/24/2021 32.5  30.0 - 36.0 g/dL Final  . RDW 86/57/8469 13.2  11.5 - 15.5 % Final  . Platelets 02/24/2021 280  150 - 400 K/uL Final  . nRBC 02/24/2021 0.0  0.0 - 0.2 % Final   Performed at [facility], [address] [place], [place] [address]  . Sodium 02/24/2021 135  135 - 145 mmol/L Final   DELTA CHECK NOTED  . Potassium 02/24/2021 4.2  3.5 - 5.1 mmol/L Final  . Chloride 02/24/2021 103  98 - 111 mmol/L Final  . CO2 02/24/2021 26  22 - 32 mmol/L Final  . Glucose, Bld 02/24/2021 152* 70 - 99 mg/dL Final   Glucose reference range applies only to samples taken after fasting for at least 8 hours.  . BUN 02/24/2021 17  8 - 23 mg/dL Final  . Creatinine, Ser 02/24/2021 0.65  0.44 - 1.00 mg/dL Final  . Calcium 84/13/2440 8.7* 8.9 - 10.3 mg/dL Final  . GFR, Estimated 02/24/2021 >60  >60 mL/min Final   Comment: (NOTE) Calculated using the CKD-EPI Creatinine Equation (2021)   . Anion gap 02/24/2021 6  5 - 15 Final   Performed at [facility], [address] [place], [place] [address]  Hospital Outpatient Visit on 02/20/2021  Component Date Value Ref Range Status  . SARS Coronavirus 2 02/20/2021 NEGATIVE  NEGATIVE Final   Comment: (NOTE) SARS-CoV-2 target nucleic acids are NOT DETECTED.  The SARS-CoV-2 RNA is generally detectable in upper and lower respiratory specimens during the acute phase of infection. Negative results do not preclude SARS-CoV-2 infection, do not rule out co-infections with other pathogens, and should not be used as the sole basis for treatment or other patient management decisions. Negative results must be combined with clinical observations, patient history, and epidemiological information. The expected result is Negative.  Fact Sheet for Patients: HairSlick.no  Fact Sheet for Healthcare Providers: quierodirigir.com  This test is not yet approved or cleared by the [place] FDA and  has been authorized for detection and/or diagnosis of SARS-CoV-2 by FDA under an Emergency Use Authorization (EUA). This EUA will remain  in effect (meaning this test can be used) for the duration of  the COVID-19 declaration under Se                          ction 564(b)(1) of the Act, 21 U.S.C. section 360bbb-3(b)(1), unless the authorization is terminated or revoked sooner.  Performed at [facility], [address] [place], [place] [address]   Hospital Outpatient Visit on 02/17/2021  Component Date Value Ref Range Status  . MRSA, PCR 02/17/2021 NEGATIVE  NEGATIVE Final  . Staphylococcus aureus 02/17/2021 POSITIVE* NEGATIVE Final   Comment: (NOTE) The Xpert SA Assay (FDA approved for NASAL specimens in patients 20 years of age and older), is one component  of a comprehensive surveillance program. It is not intended to diagnose infection nor to guide or monitor treatment. Performed at [facility], [address], [place], [place] [address]   . WBC 02/17/2021 6.4  4.0 - 10.5 K/uL Final  . RBC 02/17/2021 5.52* 3.87 - 5.11 MIL/uL Final  . Hemoglobin 02/17/2021 15.7* 12.0 - 15.0 g/dL Final  . HCT 15/61/5379 47.9* 36.0 - 46.0 % Final  . MCV 02/17/2021 86.8  80.0 - 100.0 fL Final  . MCH 02/17/2021 28.4  26.0 - 34.0 pg Final  . MCHC 02/17/2021 32.8  30.0 - 36.0 g/dL Final  . RDW 43/27/6147 13.2  11.5 - 15.5 % Final  . Platelets 02/17/2021 296  150 - 400 K/uL Final  . nRBC 02/17/2021 0.0  0.0 - 0.2 % Final   Performed at [facility], [address], [place], [place] [address]     X-Rays:No results found.  EKG:No orders found for this or any previous visit.   Hospital Course: Donna Padilla is a 65 y.o. who was admitted to [facility]. They were brought to the operating room on 02/23/2021 and underwent Procedure(s): TOTAL KNEE ARTHROPLASTY.  Patient tolerated the procedure well and was later transferred to the recovery room and then to the orthopaedic floor for postoperative care. They were given PO and IV analgesics for pain control following their surgery. They were given 24 hours of postoperative antibiotics of   Anti-infectives (From admission, onward)   Start     Dose/Rate Route Frequency Ordered Stop   02/23/21 1830  ceFAZolin (ANCEF) IVPB 2g/100 mL premix        2 g 200 mL/hr over 30 Minutes Intravenous Every 6 hours 02/23/21 1527 02/24/21 0736   02/23/21 1015  ceFAZolin (ANCEF) IVPB 2g/100 mL premix        2 g 200 mL/hr over 30 Minutes Intravenous On call to O.R. 02/23/21 1001 02/23/21 1307     and started on DVT prophylaxis in the form of Aspirin.   PT and OT were ordered for total joint protocol. Discharge planning consulted to help with postop disposition and equipment needs.  Patient had a good night on the evening of surgery. They started to get up OOB with therapy on POD #0. Pt was seen during rounds and was ready to go home pending progress with therapy. She worked with therapy on POD #1 and was meeting her goals. Pt was discharged to home later that day in stable condition.  Diet: Regular diet Activity: WBAT Follow-up: in 2 weeks Disposition: Home with OPPT Discharged Condition: stable   Discharge Instructions    Call MD / Call 911   Complete by: As directed    If you experience chest pain or shortness of breath, CALL 911 and be transported to the hospital emergency room.  If you develope a fever above 101 F, pus (white drainage) or increased drainage or redness at the wound, or calf pain, call your surgeon's office.   Change dressing   Complete by: As directed    You may remove the bulky bandage (ACE wrap and gauze) two days after surgery. You will have an adhesive waterproof bandage underneath. Leave this in place until your first follow-up appointment.   Constipation Prevention   Complete by: As directed    Drink plenty of fluids.  Prune juice may be helpful.  You may use a stool softener, such as Colace (over the counter) 100 mg twice a day.  Use  MiraLax (over the counter) for constipation as needed.   Diet - low sodium heart healthy   Complete by: As directed    Do not put a  pillow under the knee. Place it under the heel.   Complete by: As directed    Driving restrictions   Complete by: As directed    No driving for two weeks   Post-operative opioid taper instructions:   Complete by: As directed    POST-OPERATIVE OPIOID TAPER INSTRUCTIONS: It is important to wean off of your opioid medication as soon as possible. If you do not need pain medication after your surgery it is ok to stop day one. Opioids include: Codeine, Hydrocodone(Norco, Vicodin), Oxycodone(Percocet, oxycontin) and hydromorphone amongst others.  Long term and even short term use of opiods can cause: Increased pain response Dependence Constipation Depression Respiratory depression And more.  Withdrawal symptoms can include Flu like symptoms Nausea, vomiting And more Techniques to manage these symptoms Hydrate well Eat regular healthy meals Stay active Use relaxation techniques(deep breathing, meditating, yoga) Do Not substitute Alcohol to help with tapering If you have been on opioids for less than two weeks and do not have pain than it is ok to stop all together.  Plan to wean off of opioids This plan should start within one week post op of your joint replacement. Maintain the same interval or time between taking each dose and first decrease the dose.  Cut the total daily intake of opioids by one tablet each day Next start to increase the time between doses. The last dose that should be eliminated is the evening dose.      TED hose   Complete by: As directed    Use stockings (TED hose) for three weeks on both leg(s).  You may remove them at night for sleeping.   Weight bearing as tolerated   Complete by: As directed      Allergies as of 02/24/2021   No Known Allergies     Medication List    STOP taking these medications   diclofenac Sodium 1 % Gel Commonly known as: VOLTAREN   ibuprofen 800 MG tablet Commonly known as: ADVIL   meloxicam 15 MG tablet Commonly known  as: MOBIC     TAKE these medications   Advil Cold/Sinus 30-200 MG Tabs Generic drug: Pseudoephedrine-Ibuprofen Take 1 tablet by mouth daily as needed (sinus congestion).   aspirin 325 MG EC tablet Take 1 tablet (325 mg total) by mouth 2 (two) times daily for 20 days. Then take one 81 mg aspirin once a day for three weeks. Then discontinue aspirin.   gabapentin 300 MG capsule Commonly known as: NEURONTIN Take a 300 mg capsule three times a day for two weeks following surgery.Then take a 300 mg capsule two times a day for two weeks. Then take a 300 mg capsule once a day for two weeks. Then discontinue.   levothyroxine 137 MCG tablet Commonly known as: SYNTHROID Take 137 mcg by mouth every morning.   methocarbamol 500 MG tablet Commonly known as: ROBAXIN Take 1 tablet (500 mg total) by mouth every 6 (six) hours as needed for muscle spasms.   oxyCODONE 5 MG immediate release tablet Commonly known as: Oxy IR/ROXICODONE Take 1-2 tablets (5-10 mg total) by mouth every 6 (six) hours as needed for severe pain.   traMADol 50 MG tablet Commonly known as: ULTRAM Take 1-2 tablets (50-100 mg total) by mouth every 6 (six) hours as needed for moderate pain.  Discharge Care Instructions  (From admission, onward)         Start     Ordered   02/24/21 0000  Weight bearing as tolerated        02/24/21 0732   02/24/21 0000  Change dressing       Comments: You may remove the bulky bandage (ACE wrap and gauze) two days after surgery. You will have an adhesive waterproof bandage underneath. Leave this in place until your first follow-up appointment.   02/24/21 0732          Follow-up Information    Ollen GrossAluisio, Frank, MD. Schedule an appointment as soon as possible for a visit on 03/10/2021.   Specialty: Orthopedic Surgery Contact information: [address] [place] [id number] [phone]               Signed: Arther AbbottKristie Daziah Hesler, PA-C Orthopedic  Surgery 03/02/2021, 12:47 PM

## 2021-07-27 ENCOUNTER — Other Ambulatory Visit: Payer: Self-pay | Admitting: Nurse Practitioner

## 2021-07-27 DIAGNOSIS — Z1231 Encounter for screening mammogram for malignant neoplasm of breast: Secondary | ICD-10-CM

## 2021-09-04 ENCOUNTER — Other Ambulatory Visit: Payer: Self-pay

## 2021-09-04 ENCOUNTER — Ambulatory Visit
Admission: RE | Admit: 2021-09-04 | Discharge: 2021-09-04 | Disposition: A | Discharge status: Home / Self Care | Payer: Medicare Other | Source: Ambulatory Visit | Attending: Nurse Practitioner | Admitting: Nurse Practitioner

## 2021-09-04 DIAGNOSIS — Z1231 Encounter for screening mammogram for malignant neoplasm of breast: Secondary | ICD-10-CM

## 2022-08-05 ENCOUNTER — Other Ambulatory Visit: Payer: Self-pay | Admitting: Obstetrics and Gynecology

## 2022-08-05 DIAGNOSIS — Z1231 Encounter for screening mammogram for malignant neoplasm of breast: Secondary | ICD-10-CM

## 2022-08-18 NOTE — Progress Notes (Signed)
66 y.o. [id number] Widowed White or Caucasian Not Hispanic or Latino female here for annual exam.  No vaginal bleeding.   She is having urinary urgency and some urge incontinence. Leaking small-large amounts  daily. Not wearing a pad, goes to the bathroom frequently to prevent leakage. No dysuria. No GSI. Nocturia 1-2 x a night, voids normal amounts.    Husband died in 05/07/2023 79 years old. He had been sick for a while. Married x 47 years. Kids are a good support. She is doing okay.   Mom died in Mar 07, 2023.   Patient's last menstrual period was 09/01/2010.          Sexually active: No.  The current method of family planning is post menopausal status.    Exercising: No.  The patient does not participate in regular exercise at present. Smoker:  no  Health Maintenance: Pap:  06/26/18 WNL Hr HPV Neg 04/25/14 WNL Hr HPV Neg  History of abnormal Pap:  no MMG:  09/04/21 density B Bi-rads 1 neg  BMD:   Mar 06, 2009 was normal per patient  Colonoscopy: @54  per patient  TDaP:  unsure  Gardasil: n/a   reports that she has quit smoking. She has never used smokeless tobacco. She reports that she does not currently use alcohol. She reports that she does not use drugs. Rare ETOH. Retired in 1/23 from 2/23. Has a son and a daughter, they both have 2 kids.    Past Medical History:  Diagnosis Date   Arthritis    Complication of anesthesia    Goiter    HSV-1 infection    Hypothyroidism    PONV (postoperative nausea and vomiting)    Simple endometrial hyperplasia without atypia 09/2007   recommended f/u bx in one year/Biopsy in 12/2008 was benign.    Past Surgical History:  Procedure Laterality Date   APPENDECTOMY  1970   BREAST BIOPSY  1972   Lump removed-benign   BREAST EXCISIONAL BIOPSY Right    benign   DILATION AND CURETTAGE OF UTERUS     HYSTEROSCOPY  09/2001   with D&C, Polyp   THYROIDECTOMY  03/06/05   benign   TMJ ARTHROPLASTY  1994   TOTAL KNEE ARTHROPLASTY Right 02/23/2021   Procedure:  TOTAL KNEE ARTHROPLASTY;  Surgeon: 02/25/2021, MD;  Location: [facility];  Service: Orthopedics;  Laterality: Right;    Current Outpatient Medications  Medication Sig Dispense Refill   gabapentin (NEURONTIN) 300 MG capsule Take a 300 mg capsule three times a day for two weeks following surgery.Then take a 300 mg capsule two times a day for two weeks. Then take a 300 mg capsule once a day for two weeks. Then discontinue. 84 capsule 0   levothyroxine (SYNTHROID) 137 MCG tablet Take 137 mcg by mouth every morning.     methocarbamol (ROBAXIN) 500 MG tablet Take 1 tablet (500 mg total) by mouth every 6 (six) hours as needed for muscle spasms. 40 tablet 0   oxyCODONE (OXY IR/ROXICODONE) 5 MG immediate release tablet Take 1-2 tablets (5-10 mg total) by mouth every 6 (six) hours as needed for severe pain. 42 tablet 0   Pseudoephedrine-Ibuprofen (ADVIL COLD/SINUS) 30-200 MG TABS Take 1 tablet by mouth daily as needed (sinus congestion).     traMADol (ULTRAM) 50 MG tablet Take 1-2 tablets (50-100 mg total) by mouth every 6 (six) hours as needed for moderate pain. 40 tablet 0   No current facility-administered medications for this visit.    Family History  Problem Relation Age of Onset   Cancer Father 71       esophageal   Diabetes Brother    Diabetes Maternal Grandfather    Colon cancer Maternal Grandmother    Atrial fibrillation Mother     Review of Systems  All other systems reviewed and are negative.   Exam:   LMP 09/01/2010   Weight change: @WEIGHTCHANGE @ Height:      Ht Readings from Last 3 Encounters:  02/23/21 5\' 10"  (1.778 m)  02/17/21 5\' 10"  (1.778 m)  08/22/20 5\' 9"  (1.753 m)  BP 122/64 Pulse 66 Ht 5' 8.5" (1.74 m) Wt 210 lb (95.3 kg) LMP 09/01/2010 SpO2 100% BMI 31.47 kg/m BSA 2.15 m  General appearance: alert, cooperative and appears stated age Head: Normocephalic, without obvious abnormality, atraumatic Neck: no adenopathy, supple, symmetrical, trachea midline and  thyroid normal to inspection and palpation Breasts: normal appearance, no masses or tenderness Abdomen: soft, non-tender; non distended,  no masses,  no organomegaly Extremities: extremities normal, atraumatic, no cyanosis or edema Skin: Skin color, texture, turgor normal. No rashes or lesions Lymph nodes: Cervical, supraclavicular, and axillary nodes normal. No abnormal inguinal nodes palpated Neurologic: Grossly normal   Pelvic: External genitalia:  no lesions              Urethra:  normal appearing urethra with no masses, tenderness or lesions              Bartholins and Skenes: normal                 Vagina: normal appearing vagina with normal color and discharge, no lesions              Cervix: no lesions               Bimanual Exam:  Uterus:  normal size, contour, position, consistency, mobility, non-tender              Adnexa: no mass, fullness, tenderness               Rectovaginal: Confirms               Anus:  normal sphincter tone, no lesions  Gae Dry, CMA chaperoned for the exam.  1. Encounter for breast and pelvic examination Discussed breast self exam Discussed calcium and vit D intake Mammogram next month  2. Hypoestrogenism - DG Bone Density; Future  3. Screening for cervical cancer - Cytology - PAP  4. Colon cancer screening - Ambulatory referral to Gastroenterology  5. H/O cold sores - valACYclovir (VALTREX) 1000 MG tablet; Take 2 tablets po q 12 hours x 2  Dispense: 30 tablet; Refill: 1  6. Urge incontinence Discussed option of PT and medication -bladder training information given - Urinalysis, Complete

## 2022-08-25 ENCOUNTER — Ambulatory Visit (INDEPENDENT_AMBULATORY_CARE_PROVIDER_SITE_OTHER): Payer: Medicare Other | Admitting: Obstetrics and Gynecology

## 2022-08-25 ENCOUNTER — Encounter: Payer: Self-pay | Admitting: Obstetrics and Gynecology

## 2022-08-25 ENCOUNTER — Other Ambulatory Visit (HOSPITAL_COMMUNITY)
Admission: RE | Admit: 2022-08-25 | Discharge: 2022-08-25 | Disposition: A | Discharge status: Home / Self Care | Payer: Medicare Other | Source: Ambulatory Visit | Attending: Obstetrics and Gynecology | Admitting: Obstetrics and Gynecology

## 2022-08-25 ENCOUNTER — Telehealth: Payer: Self-pay | Admitting: *Deleted

## 2022-08-25 VITALS — BP 122/64 | HR 66 | Ht 68.5 in | Wt 210.0 lb

## 2022-08-25 DIAGNOSIS — Z1151 Encounter for screening for human papillomavirus (HPV): Secondary | ICD-10-CM | POA: Diagnosis not present

## 2022-08-25 DIAGNOSIS — Z124 Encounter for screening for malignant neoplasm of cervix: Secondary | ICD-10-CM

## 2022-08-25 DIAGNOSIS — N3941 Urge incontinence: Secondary | ICD-10-CM | POA: Diagnosis not present

## 2022-08-25 DIAGNOSIS — Z8619 Personal history of other infectious and parasitic diseases: Secondary | ICD-10-CM | POA: Diagnosis not present

## 2022-08-25 DIAGNOSIS — Z9189 Other specified personal risk factors, not elsewhere classified: Secondary | ICD-10-CM | POA: Diagnosis not present

## 2022-08-25 DIAGNOSIS — Z01419 Encounter for gynecological examination (general) (routine) without abnormal findings: Secondary | ICD-10-CM

## 2022-08-25 DIAGNOSIS — Z1211 Encounter for screening for malignant neoplasm of colon: Secondary | ICD-10-CM

## 2022-08-25 DIAGNOSIS — E2839 Other primary ovarian failure: Secondary | ICD-10-CM

## 2022-08-25 LAB — URINALYSIS, COMPLETE
Glucose, UA: NEGATIVE
Hyaline Cast: NONE SEEN /LPF
Nitrite: NEGATIVE
Specific Gravity, Urine: 1.025 (ref 1.001–1.035)
pH: 5.5 (ref 5.0–8.0)

## 2022-08-25 MED ORDER — VALACYCLOVIR HCL 1 G PO TABS: ORAL_TABLET | ORAL | 1 refills | Status: DC

## 2022-08-25 NOTE — Telephone Encounter (Signed)
-----   Message from Salvadore Dom, MD sent at 08/25/2022 11:42 AM EDT ----- Referral placed to dr Collene Mares for a colonoscopy

## 2022-08-25 NOTE — Patient Instructions (Addendum)

## 2022-08-27 ENCOUNTER — Other Ambulatory Visit: Payer: Medicare Other

## 2022-08-27 NOTE — Telephone Encounter (Signed)
Dr.Mann called back stating this patient is already established patient. Dr.Mann declined referral patient has too many cancelled appointments with her. Please advise

## 2022-08-31 NOTE — Telephone Encounter (Signed)
Please let the patient know and place a referral to Dr Laurier Nancy at [facility].

## 2022-09-01 ENCOUNTER — Ambulatory Visit
Admission: RE | Admit: 2022-09-01 | Discharge: 2022-09-01 | Disposition: A | Discharge status: Home / Self Care | Payer: Medicare Other | Source: Ambulatory Visit | Attending: Obstetrics and Gynecology | Admitting: Obstetrics and Gynecology

## 2022-09-01 DIAGNOSIS — E2839 Other primary ovarian failure: Secondary | ICD-10-CM

## 2022-09-01 NOTE — Telephone Encounter (Signed)
Patient informed, referral placed.

## 2022-09-02 LAB — CYTOLOGY - PAP
Comment: NEGATIVE
Diagnosis: UNDETERMINED — AB
High risk HPV: NEGATIVE

## 2022-09-06 ENCOUNTER — Encounter: Payer: Self-pay | Admitting: Gastroenterology

## 2022-09-13 ENCOUNTER — Ambulatory Visit
Admission: RE | Admit: 2022-09-13 | Discharge: 2022-09-13 | Disposition: A | Discharge status: Home / Self Care | Payer: Medicare Other | Source: Ambulatory Visit | Attending: Obstetrics and Gynecology | Admitting: Obstetrics and Gynecology

## 2022-09-13 ENCOUNTER — Ambulatory Visit (AMBULATORY_SURGERY_CENTER): Payer: Self-pay | Admitting: *Deleted

## 2022-09-13 VITALS — Ht 68.5 in | Wt 209.8 lb

## 2022-09-13 DIAGNOSIS — Z1211 Encounter for screening for malignant neoplasm of colon: Secondary | ICD-10-CM

## 2022-09-13 DIAGNOSIS — Z1231 Encounter for screening mammogram for malignant neoplasm of breast: Secondary | ICD-10-CM

## 2022-09-13 MED ORDER — NA SULFATE-K SULFATE-MG SULF 17.5-3.13-1.6 GM/177ML PO SOLN: 1.0000 | Freq: Once | ORAL | 0 refills | Status: AC

## 2022-09-13 NOTE — Progress Notes (Signed)
No egg or soy allergy known to patient  No issues known to pt with past sedation with any surgeries or procedures Patient denies ever being told they had issues or difficulty with intubation  No FH of Malignant Hyperthermia Pt is not on diet pills Pt is not on home 02  Pt is not on blood thinners  Pt denies issues with constipation  No A fib or A flutter Have any cardiac testing pending--NO Pt instructed to use Singlecare.com or GoodRx for a price reduction on prep   

## 2022-10-12 ENCOUNTER — Encounter: Payer: Medicare Other | Admitting: Gastroenterology

## 2022-11-03 ENCOUNTER — Telehealth: Payer: Self-pay | Admitting: Gastroenterology

## 2022-11-03 NOTE — Telephone Encounter (Signed)
Patient rescheduled for procedure please update prep instructions.

## 2022-11-03 NOTE — Telephone Encounter (Signed)
Phoned pt to let her know that I sent new instructions to her via my chart.  She requested that I mail them to her as well.

## 2022-11-08 ENCOUNTER — Encounter: Payer: Medicare Other | Admitting: Gastroenterology

## 2022-12-24 ENCOUNTER — Other Ambulatory Visit (HOSPITAL_COMMUNITY): Payer: Self-pay | Admitting: Physician Assistant

## 2022-12-24 ENCOUNTER — Ambulatory Visit (HOSPITAL_COMMUNITY)
Admission: RE | Admit: 2022-12-24 | Discharge: 2022-12-24 | Disposition: A | Discharge status: Home / Self Care | Payer: Medicare Other | Source: Ambulatory Visit | Attending: Sports Medicine | Admitting: Sports Medicine

## 2022-12-24 DIAGNOSIS — M79661 Pain in right lower leg: Secondary | ICD-10-CM | POA: Insufficient documentation

## 2022-12-24 DIAGNOSIS — M79605 Pain in left leg: Secondary | ICD-10-CM

## 2022-12-24 DIAGNOSIS — M79662 Pain in left lower leg: Secondary | ICD-10-CM | POA: Insufficient documentation

## 2022-12-24 NOTE — Progress Notes (Signed)
Lower extremity venous left study completed.  Preliminary results relayed to [facility].   See CV Proc for preliminary results report.   Darlin Coco, RDMS, RVT

## 2022-12-28 ENCOUNTER — Encounter: Payer: Medicare Other | Admitting: Gastroenterology

## 2023-01-10 ENCOUNTER — Ambulatory Visit (AMBULATORY_SURGERY_CENTER): Payer: Medicare Other | Admitting: *Deleted

## 2023-01-10 VITALS — Ht 69.0 in | Wt 203.0 lb

## 2023-01-10 DIAGNOSIS — Z1211 Encounter for screening for malignant neoplasm of colon: Secondary | ICD-10-CM

## 2023-01-10 NOTE — Progress Notes (Signed)
No egg or soy allergy known to patient  No issues known to pt with past sedation with any surgeries or procedures Patient denies ever being told they had issues or difficulty with intubation  No FH of Malignant Hyperthermia Pt is not on diet pills Pt is not on  home 02  Pt is not on blood thinners  Pt denies issues with constipation  No A fib or A flutter Have any cardiac testing pending--no Pt instructed to use Singlecare.com or GoodRx for a price reduction on prep    Already has suprep at home.  Patient's chart reviewed by Osvaldo Angst CNRA prior to previsit and patient appropriate for the [facility].  Previsit completed and red dot placed by patient's name on their procedure day (on provider's schedule).

## 2023-02-08 ENCOUNTER — Encounter: Payer: Medicare Other | Admitting: Gastroenterology

## 2023-03-23 ENCOUNTER — Ambulatory Visit (AMBULATORY_SURGERY_CENTER): Payer: Medicare Other

## 2023-03-23 VITALS — Ht 70.0 in | Wt 199.0 lb

## 2023-03-23 DIAGNOSIS — Z1211 Encounter for screening for malignant neoplasm of colon: Secondary | ICD-10-CM

## 2023-03-23 NOTE — Progress Notes (Signed)
Pre visit completed via phone call; Patient verified name, DOB, and address;  No egg or soy allergy known to patient;  No issues known to pt with past sedation with any surgeries or procedures; Patient denies ever being told they had issues or difficulty with intubation;  No FH of Malignant Hyperthermia; Pt is not on diet pills; Pt is not on home 02;  Pt is not on blood thinners;  Pt denies issues with constipation;  No A fib or A flutter; Have any cardiac testing pending--NO Pt instructed to use Singlecare.com or GoodRx for a price reduction on prep;  Insurance verified during PV appt=Medicare A/B, [employer]  Patient's chart reviewed by Cathlyn Parsons CNRA prior to previsit and patient appropriate for the [facility].  Previsit completed and red dot placed by patient's name on their procedure day (on provider's schedule).    Instructions printed and mailed to the patient per her request;    Patient reports she already has the Suprep prep kit at home- no RX sent in during PV appt;   Patient has adamantly requested to have her colon as well as an EGD on the same day, however, she received information from "a friend" that her insurance might not pay if she had them on the same day; patient reports she is going to call her insurance company today and find out some information and call back to the office- patient was scheduled (during PV) to have an appt with the APP to discuss the need for EGD should her insurance not cover both procedures on the same day; patient aware she will need to cancel her "follow up appointment with Gunnar Fusi" if her insurance company will not cover both procedures in the same day, and she will need to speak with Dr. Lavon Paganini on her colon date in order to be assessed for the need of the EGD;

## 2023-04-22 ENCOUNTER — Encounter: Payer: Medicare Other | Admitting: Gastroenterology

## 2023-06-02 ENCOUNTER — Ambulatory Visit: Payer: Medicare Other | Admitting: Nurse Practitioner

## 2023-06-07 ENCOUNTER — Encounter: Payer: Self-pay | Admitting: Nurse Practitioner

## 2023-06-07 ENCOUNTER — Ambulatory Visit (INDEPENDENT_AMBULATORY_CARE_PROVIDER_SITE_OTHER): Payer: Medicare Other | Admitting: Nurse Practitioner

## 2023-06-07 VITALS — BP 110/70 | HR 86 | Ht 70.0 in | Wt 200.0 lb

## 2023-06-07 DIAGNOSIS — Z1211 Encounter for screening for malignant neoplasm of colon: Secondary | ICD-10-CM

## 2023-06-07 DIAGNOSIS — K219 Gastro-esophageal reflux disease without esophagitis: Secondary | ICD-10-CM | POA: Diagnosis not present

## 2023-06-07 MED ORDER — OMEPRAZOLE 20 MG PO CPDR: 20.0000 mg | DELAYED_RELEASE_CAPSULE | Freq: Every day | ORAL | 1 refills | Status: DC

## 2023-06-07 NOTE — Patient Instructions (Addendum)
You have been scheduled for an endoscopy and colonoscopy. Please follow the written instructions given to you at your visit today.  Take your thyroid medication in the morning upon awakening, one hour later take Omeprazole 20mg  one capsule and 30 minutes later eat breakfast.   Please pick up your prep supplies at the pharmacy within the next 1-3 days.  If you use inhalers (even only as needed), please bring them with you on the day of your procedure.  DO NOT TAKE 7 DAYS PRIOR TO TEST- Trulicity (dulaglutide) Ozempic, Wegovy (semaglutide) Mounjaro (tirzepatide) Bydureon Bcise (exanatide extended release)  DO NOT TAKE 1 DAY PRIOR TO YOUR TEST Rybelsus (semaglutide) Adlyxin (lixisenatide) Victoza (liraglutide) Byetta (exanatide) ___________________________________________________________________________  Due to recent changes in healthcare laws, you may see the results of your imaging and laboratory studies on MyChart before your provider has had a chance to review them.  We understand that in some cases there may be results that are confusing or concerning to you. Not all laboratory results come back in the same time frame and the provider may be waiting for multiple results in order to interpret others.  Please give [place] 48 hours in order for your provider to thoroughly review all the results before contacting the office for clarification of your results.   Thank you for trusting me with your gastrointestinal care!   Alcide Evener, CRNP

## 2023-06-07 NOTE — Progress Notes (Signed)
06/07/2023 Donna Padilla [id number] Sep 11, 1956   CHIEF COMPLAINT: Schedule an upper endoscopy and colonoscopy  HISTORY OF PRESENT ILLNESS: Donna Padilla is a 67 year old female with a past medical history of rheumatoid arthritis, osteoporosis, hypothyroidism and GERD.  She presents today to schedule an upper endoscopy and colonoscopy.  S/P right ectomy secondary to multinodular goiter 2007.  She has a history of GERD.  She describes having active daily heartburn for the past 6 to 12 months which is worse if she eats dairy products or if she eats late in the evening.  She takes Alka-Seltzer or Rolaids as needed with symptom relief.  She has taken her husband's Esomeprazole on a few occasions.  No dysphagia.  No upper or lower abdominal pain.  She passes normal brown bowel movement daily.  No rectal bleeding or black stools.  Infrequent NSAID use.  Her father died from esophageal cancer at the age of 58, he was a smoker.  Maternal grandmother with history of colon cancer in her 35s.  She underwent an EGD and colonoscopy by Dr. Loreta Ave 12/19/2006 which she reported were normal.  Laboratory studies 04/20/2023: TSH 0.234.  WBC count 4.7.  Hemoglobin 15.5.  Hematocrit 46.7.  Platelet 275.  Glucose 77.  BUN 10.  Creatinine 0.83.  Sodium 141.  Potassium 4.3.  Abdomen 4.1.  Total bili 0.3.  Alk phos 97.  AST 15.  ALT 17.   Past Medical History:  Diagnosis Date   Arthritis    GERD (gastroesophageal reflux disease)    " a little"   Goiter    on meds   HSV-1 infection    Hypothyroidism    Osteoporosis    not on meds at this time (03/23/2023)   PONV (postoperative nausea and vomiting)    Rheumatoid arthritis (HCC)    on meds   Simple endometrial hyperplasia without atypia 09/2007   recommended f/u bx in one year/Biopsy in 12/2008 was benign.   Past Surgical History:  Procedure Laterality Date   APPENDECTOMY  1970   BREAST BIOPSY Right 1972   Lump removed-benign   DILATION AND CURETTAGE  OF UTERUS     HYSTEROSCOPY  09/2001   with D&C, Polyp   THYROIDECTOMY  2006   benign   TMJ ARTHROPLASTY  1994   TOTAL KNEE ARTHROPLASTY Right 02/23/2021   Procedure: TOTAL KNEE ARTHROPLASTY;  Surgeon: Ollen Gross, MD;  Location: [facility];  Service: Orthopedics;  Laterality: Right;   WISDOM TOOTH EXTRACTION     Social History: She smoked cigarettes 1/2 ppd from the age 32 to 51. She infrequently drinks one glass of wine or one beer. No drug use.   Family History: family history includes Atrial fibrillation in her mother; Colon cancer in her maternal grandmother; Diabetes in her brother and maternal grandfather; Esophageal cancer (age of onset: 20) in her father; Pancreatic cancer in her maternal aunt; Prostate cancer in her father; Transient ischemic attack in her mother.  No Known Allergies    Outpatient Encounter Medications as of 06/07/2023  Medication Sig   hydroxychloroquine (PLAQUENIL) 200 MG tablet Take 400 mg by mouth daily.   ibuprofen (ADVIL) 800 MG tablet Take 800 mg by mouth every 6 (six) hours as needed for mild pain, headache, fever, moderate pain or cramping.   levothyroxine (SYNTHROID) 125 MCG tablet Take 1 tablet by mouth daily.   pseudoephedrine (SUDAFED) 30 MG tablet Take 30 mg by mouth as needed for congestion. Take as needed   valACYclovir (  VALTREX) 1000 MG tablet Take 2 tablets po q 12 hours x 2 (Patient taking differently: Take 2 tablets po q 12 hours x 2  , take as needed for cold sores)   [DISCONTINUED] levothyroxine (SYNTHROID) 137 MCG tablet Take 137 mcg by mouth every morning.   [DISCONTINUED] meloxicam (MOBIC) 15 MG tablet Take 15 mg by mouth daily. (Patient not taking: Reported on 01/10/2023)   [DISCONTINUED] Na Sulfate-K Sulfate-Mg Sulf 17.5-3.13-1.6 GM/177ML SOLN SMARTSIG:1 Kit(s) By Mouth Once   No facility-administered encounter medications on file as of 06/07/2023.    REVIEW OF SYSTEMS:  Gen: Denies fever, sweats or chills. No weight loss.  CV: Denies  chest pain, palpitations or edema. Resp: Denies cough, shortness of breath of hemoptysis.  GI: See HPI. GU: Denies urinary burning, blood in urine, increased urinary frequency or incontinence. MS: Denies joint pain, muscles aches or weakness. Derm: Denies rash, itchiness, skin lesions or unhealing ulcers. Psych: Denies depression, anxiety, memory loss or confusion. Heme: Denies bruising, easy bleeding. Neuro:  Denies headaches, dizziness or paresthesias. Endo:  Denies any problems with DM, thyroid or adrenal function.  PHYSICAL EXAM: Wt Readings from Last 3 Encounters:  06/07/23 200 lb (90.7 kg)  03/23/23 199 lb (90.3 kg)  01/10/23 203 lb (92.1 kg)    LMP 09/01/2010  General: 67 year old female in no acute distress. Head: Normocephalic and atraumatic. Eyes:  Sclerae non-icteric, conjunctive pink. Ears: Normal auditory acuity. Mouth: Dentition intact. No ulcers or lesions.  Neck: Supple, no lymphadenopathy or thyromegaly.  Lungs: Clear bilaterally to auscultation without wheezes, crackles or rhonchi. Heart: Regular rate and rhythm. No murmur, rub or gallop appreciated.  Abdomen: Soft, nontender, nondistended. No masses. No hepatosplenomegaly. Normoactive bowel sounds x 4 quadrants.  Rectal: Deferred. Musculoskeletal: Symmetrical with no gross deformities. Skin: Warm and dry. No rash or lesions on visible extremities. Extremities: No edema. Neurological: Alert oriented x 4, no focal deficits.  Psychological:  Alert and cooperative. Normal mood and affect.  ASSESSMENT AND PLAN:  67 year old female presents to schedule screening colonoscopy.  Colonoscopy 2008 reported as normal.  Maternal grandmother with history of colon cancer. -Colonoscopy benefits and risks discussed including risk with sedation, risk of bleeding, perforation and infection   GERD, chronic  -GERD handout -Omeprazole 20 mg 1 capsule p.o. to be taken 30 minutes before breakfast -EGD benefits and risks discussed  including risk with sedation, risk of bleeding, perforation and infection   Rheumatoid arthritis on Hydroxychloroquine  Further recommendations to be determined after the valve evaluation completed          CC:  Marrian Salvage, PA-C

## 2023-06-09 NOTE — Progress Notes (Signed)
Agree with assessment/plan.  Raj Paquita Printy, MD [facility] [phone]  

## 2023-08-07 ENCOUNTER — Encounter: Payer: Self-pay | Admitting: Certified Registered Nurse Anesthetist

## 2023-08-15 ENCOUNTER — Encounter: Payer: Self-pay | Admitting: Gastroenterology

## 2023-08-15 ENCOUNTER — Ambulatory Visit: Payer: Medicare Other | Admitting: Gastroenterology

## 2023-08-15 VITALS — BP 140/91 | HR 88 | Temp 98.4°F | Resp 16 | Ht 70.0 in | Wt 200.0 lb

## 2023-08-15 DIAGNOSIS — D120 Benign neoplasm of cecum: Secondary | ICD-10-CM

## 2023-08-15 DIAGNOSIS — D124 Benign neoplasm of descending colon: Secondary | ICD-10-CM | POA: Diagnosis not present

## 2023-08-15 DIAGNOSIS — Z1211 Encounter for screening for malignant neoplasm of colon: Secondary | ICD-10-CM

## 2023-08-15 DIAGNOSIS — K2100 Gastro-esophageal reflux disease with esophagitis, without bleeding: Secondary | ICD-10-CM

## 2023-08-15 DIAGNOSIS — K2980 Duodenitis without bleeding: Secondary | ICD-10-CM | POA: Diagnosis not present

## 2023-08-15 DIAGNOSIS — K219 Gastro-esophageal reflux disease without esophagitis: Secondary | ICD-10-CM

## 2023-08-15 MED ORDER — SODIUM CHLORIDE 0.9 % IV SOLN: 500.0000 mL | Freq: Once | INTRAVENOUS | Status: DC

## 2023-08-15 NOTE — Progress Notes (Signed)
1055 HR > 100 with esmolol 25 mg given IV.  Patient experiencing nausea and retching.  Small amount of food noted in stomach during EGD. MD updated and Zofran 4 mg IV given, vss

## 2023-08-15 NOTE — Progress Notes (Signed)
06/07/2023 LYRIS HITCHMAN [id number] 09/21/1956     CHIEF COMPLAINT: Schedule an upper endoscopy and colonoscopy   HISTORY OF PRESENT ILLNESS: Donna Padilla. Robleto is a 67 year old female with a past medical history of rheumatoid arthritis, osteoporosis, hypothyroidism and GERD.  She presents today to schedule an upper endoscopy and colonoscopy.  S/P right ectomy secondary to multinodular goiter 2007.  She has a history of GERD.  She describes having active daily heartburn for the past 6 to 12 months which is worse if she eats dairy products or if she eats late in the evening.  She takes Alka-Seltzer or Rolaids as needed with symptom relief.  She has taken her husband's Esomeprazole on a few occasions.  No dysphagia.  No upper or lower abdominal pain.  She passes normal brown bowel movement daily.  No rectal bleeding or black stools.  Infrequent NSAID use.  Her father died from esophageal cancer at the age of 6, he was a smoker.  Maternal grandmother with history of colon cancer in her 16s.  She underwent an EGD and colonoscopy by Dr. Loreta Ave 12/19/2006 which she reported were normal.   Laboratory studies 04/20/2023: TSH 0.234.  WBC count 4.7.  Hemoglobin 15.5.  Hematocrit 46.7.  Platelet 275.  Glucose 77.  BUN 10.  Creatinine 0.83.  Sodium 141.  Potassium 4.3.  Abdomen 4.1.  Total bili 0.3.  Alk phos 97.  AST 15.  ALT 17.         Past Medical History:  Diagnosis Date   Arthritis     GERD (gastroesophageal reflux disease)      " a little"   Goiter      on meds   HSV-1 infection     Hypothyroidism     Osteoporosis      not on meds at this time (03/23/2023)   PONV (postoperative nausea and vomiting)     Rheumatoid arthritis (HCC)      on meds   Simple endometrial hyperplasia without atypia 09/2007    recommended f/u bx in one year/Biopsy in 12/2008 was benign.             Past Surgical History:  Procedure Laterality Date   APPENDECTOMY   1970   BREAST BIOPSY Right 1972    Lump  removed-benign   DILATION AND CURETTAGE OF UTERUS       HYSTEROSCOPY   09/2001    with D&C, Polyp   THYROIDECTOMY   2006    benign   TMJ ARTHROPLASTY   1994   TOTAL KNEE ARTHROPLASTY Right 02/23/2021    Procedure: TOTAL KNEE ARTHROPLASTY;  Surgeon: Ollen Gross, MD;  Location: [facility];  Service: Orthopedics;  Laterality: Right;   WISDOM TOOTH EXTRACTION            Social History: She smoked cigarettes 1/2 ppd from the age 26 to 63. She infrequently drinks one glass of wine or one beer. No drug use.    Family History: family history includes Atrial fibrillation in her mother; Colon cancer in her maternal grandmother; Diabetes in her brother and maternal grandfather; Esophageal cancer (age of onset: 62) in her father; Pancreatic cancer in her maternal aunt; Prostate cancer in her father; Transient ischemic attack in her mother.   Allergies  No Known Allergies           Outpatient Encounter Medications as of 06/07/2023  Medication Sig   hydroxychloroquine (PLAQUENIL) 200 MG tablet Take 400 mg by mouth daily.  ibuprofen (ADVIL) 800 MG tablet Take 800 mg by mouth every 6 (six) hours as needed for mild pain, headache, fever, moderate pain or cramping.   levothyroxine (SYNTHROID) 125 MCG tablet Take 1 tablet by mouth daily.   pseudoephedrine (SUDAFED) 30 MG tablet Take 30 mg by mouth as needed for congestion. Take as needed   valACYclovir (VALTREX) 1000 MG tablet Take 2 tablets po q 12 hours x 2 (Patient taking differently: Take 2 tablets po q 12 hours x 2  , take as needed for cold sores)   [DISCONTINUED] levothyroxine (SYNTHROID) 137 MCG tablet Take 137 mcg by mouth every morning.   [DISCONTINUED] meloxicam (MOBIC) 15 MG tablet Take 15 mg by mouth daily. (Patient not taking: Reported on 01/10/2023)   [DISCONTINUED] Na Sulfate-K Sulfate-Mg Sulf 17.5-3.13-1.6 GM/177ML SOLN SMARTSIG:1 Kit(s) By Mouth Once      No facility-administered encounter medications on file as of 06/07/2023.         REVIEW OF SYSTEMS:  Gen: Denies fever, sweats or chills. No weight loss.  CV: Denies chest pain, palpitations or edema. Resp: Denies cough, shortness of breath of hemoptysis.  GI: See HPI. GU: Denies urinary burning, blood in urine, increased urinary frequency or incontinence. MS: Denies joint pain, muscles aches or weakness. Derm: Denies rash, itchiness, skin lesions or unhealing ulcers. Psych: Denies depression, anxiety, memory loss or confusion. Heme: Denies bruising, easy bleeding. Neuro:  Denies headaches, dizziness or paresthesias. Endo:  Denies any problems with DM, thyroid or adrenal function.   PHYSICAL EXAM:    Wt Readings from Last 3 Encounters:  06/07/23 200 lb (90.7 kg)  03/23/23 199 lb (90.3 kg)  01/10/23 203 lb (92.1 kg)    LMP 09/01/2010  General: 67 year old female in no acute distress. Head: Normocephalic and atraumatic. Eyes:  Sclerae non-icteric, conjunctive pink. Ears: Normal auditory acuity. Mouth: Dentition intact. No ulcers or lesions.  Neck: Supple, no lymphadenopathy or thyromegaly.  Lungs: Clear bilaterally to auscultation without wheezes, crackles or rhonchi. Heart: Regular rate and rhythm. No murmur, rub or gallop appreciated.  Abdomen: Soft, nontender, nondistended. No masses. No hepatosplenomegaly. Normoactive bowel sounds x 4 quadrants.  Rectal: Deferred. Musculoskeletal: Symmetrical with no gross deformities. Skin: Warm and dry. No rash or lesions on visible extremities. Extremities: No edema. Neurological: Alert oriented x 4, no focal deficits.  Psychological:  Alert and cooperative. Normal mood and affect.   ASSESSMENT AND PLAN:   67 year old female presents to schedule screening colonoscopy.  Colonoscopy 2008 reported as normal.  Maternal grandmother with history of colon cancer. -Colonoscopy benefits and risks discussed including risk with sedation, risk of bleeding, perforation and infection    GERD, chronic  -GERD  handout -Omeprazole 20 mg 1 capsule p.o. to be taken 30 minutes before breakfast -EGD benefits and risks discussed including risk with sedation, risk of bleeding, perforation and infection    Rheumatoid arthritis on Hydroxychloroquine   Further recommendations to be determined after the valve evaluation completed     Attending physician's note   I have taken history, reviewed the chart and examined the patient. I performed a substantive portion of this encounter, including complete performance of at least one of the key components, in conjunction with the APP. I agree with the Advanced Practitioner's note, impression and recommendations.    Edman Circle, MD [facility] [phone]

## 2023-08-15 NOTE — Op Note (Signed)
[facility] Patient Name: Donna Padilla Procedure Date: 08/15/2023 10:33 AM MRN: [id number] Endoscopist: Lynann Bologna , MD, [id number] Age: 67 Referring MD:  Date of Birth: 1956/06/22 Gender: Female Account #: [id number] Procedure:                Upper GI endoscopy Indications:              Epigastric abdominal pain, Heartburn Medicines:                Monitored Anesthesia Care Procedure:                Pre-Anesthesia Assessment:                           - Prior to the procedure, a History and Physical                            was performed, and patient medications and                            allergies were reviewed. The patient's tolerance of                            previous anesthesia was also reviewed. The risks                            and benefits of the procedure and the sedation                            options and risks were discussed with the patient.                            All questions were answered, and informed consent                            was obtained. Prior Anticoagulants: The patient has                            taken no anticoagulant or antiplatelet agents. ASA                            Grade Assessment: II - A patient with mild systemic                            disease. After reviewing the risks and benefits,                            the patient was deemed in satisfactory condition to                            undergo the procedure.                           After obtaining informed consent, the endoscope was  passed under direct vision. Throughout the                            procedure, the patient's blood pressure, pulse, and                            oxygen saturations were monitored continuously. The                            GIF [id number] #[id number] was introduced through the                            mouth, and advanced to the second part of duodenum.                            The upper GI  endoscopy was accomplished without                            difficulty. The patient tolerated the procedure                            well. Scope In: Scope Out: Findings:                 LA Grade B (one or more mucosal breaks greater than                            5 mm, not extending between the tops of two mucosal                            folds) esophagitis with no bleeding was found 37 to                            38 cm from the incisors. Biopsies were taken with a                            cold forceps for histology.                           Small HH was noted. Localized mild inflammation                            characterized by erosions was found in the gastric                            antrum. Biopsies were taken with a cold forceps for                            histology.                           The examined duodenum was normal. Biopsies for  histology were taken with a cold forceps for                            evaluation of celiac disease. Complications:            No immediate complications. Estimated Blood Loss:     Estimated blood loss: none. Impression:               - LA Grade B reflux esophagitis with no bleeding.                            Biopsied.                           - Small HH.                           - Gastritis. Biopsied.                           - Normal examined duodenum. Biopsied. Recommendation:           - Patient has a contact number available for                            emergencies. The signs and symptoms of potential                            delayed complications were discussed with the                            patient. Return to normal activities tomorrow.                            Written discharge instructions were provided to the                            patient.                           - Resume previous diet.                           - Increase omeprazole 20 mg p.o. twice daily x 4                             weeks, then reduce it to once a day indefinitely                           - Avoid/minimize ibuprofen, naproxen, or other                            non-steroidal anti-inflammatory drugs.                           - Brochures regarding reflux.                           -  FU if still with problems                           - The findings and recommendations were discussed                            with the patient's family. Lynann Bologna, MD 08/15/2023 11:14:14 AM This report has been signed electronically.

## 2023-08-15 NOTE — Progress Notes (Signed)
1033 Robinul 0.1 mg IV given due large amount of secretions upon assessment.  MD made aware, vss 

## 2023-08-15 NOTE — Progress Notes (Signed)
Called to room to assist during endoscopic procedure.  Patient ID and intended procedure confirmed with present staff. Received instructions for my participation in the procedure from the performing physician.  

## 2023-08-15 NOTE — Progress Notes (Signed)
VS by CW  Pt's states no medical or surgical changes since previsit or office visit.  

## 2023-08-15 NOTE — Progress Notes (Signed)
Report given to PACU, vss 

## 2023-08-15 NOTE — Patient Instructions (Addendum)
Continue present medications. Await pathology results. Repeat colonoscopy for surveillance based on pathology results.                      Increase omeprazole 20 mg p.o. twice daily x 4 weeks, then reduce it to once a day indefinitely Avoid/minimize ibuprofen, naproxen, or other non-steroidal anti-inflammatory drugs (Advil, Aleve, Motrin) Brochures regarding reflux. Follow up if still with problems  YOU HAD AN ENDOSCOPIC PROCEDURE TODAY AT THE [facility]:   Refer to the procedure report that was given to you for any specific questions about what was found during the examination.  If the procedure report does not answer your questions, please call your gastroenterologist to clarify.  If you requested that your care partner not be given the details of your procedure findings, then the procedure report has been included in a sealed envelope for you to review at your convenience later.  YOU SHOULD EXPECT: Some feelings of bloating in the abdomen. Passage of more gas than usual.  Walking can help get rid of the air that was put into your GI tract during the procedure and reduce the bloating. If you had a lower endoscopy (such as a colonoscopy or flexible sigmoidoscopy) you may notice spotting of blood in your stool or on the toilet paper. If you underwent a bowel prep for your procedure, you may not have a normal bowel movement for a few days.  Please Note:  You might notice some irritation and congestion in your nose or some drainage.  This is from the oxygen used during your procedure.  There is no need for concern and it should clear up in a day or so.  SYMPTOMS TO REPORT IMMEDIATELY:  Following lower endoscopy (colonoscopy or flexible sigmoidoscopy):  Excessive amounts of blood in the stool  Significant tenderness or worsening of abdominal pains  Swelling of the abdomen that is new, acute  Fever of 100F or higher  Following upper endoscopy (EGD)  Vomiting of blood or coffee  ground material  New chest pain or pain under the shoulder blades  Painful or persistently difficult swallowing  New shortness of breath  Fever of 100F or higher  Black, tarry-looking stools  For urgent or emergent issues, a gastroenterologist can be reached at any hour by calling [phone]. Do not use MyChart messaging for urgent concerns.    DIET:  We do recommend a small meal at first, but then you may proceed to your regular diet.  Drink plenty of fluids but you should avoid alcoholic beverages for 24 hours.  ACTIVITY:  You should plan to take it easy for the rest of today and you should NOT DRIVE or use heavy machinery until tomorrow (because of the sedation medicines used during the test).    FOLLOW UP: Our staff will call the number listed on your records the next business day following your procedure.  We will call around 7:15- 8:00 am to check on you and address any questions or concerns that you may have regarding the information given to you following your procedure. If we do not reach you, we will leave a message.     If any biopsies were taken you will be contacted by phone or by letter within the next 1-3 weeks.  Please call us at [phone] if you have not heard about the biopsies in 3 weeks.    SIGNATURES/CONFIDENTIALITY: You and/or your care partner have signed paperwork which will be entered into  your electronic medical record.  These signatures attest to the fact that that the information above on your After Visit Summary has been reviewed and is understood.  Full responsibility of the confidentiality of this discharge information lies with you and/or your care-partner.

## 2023-08-15 NOTE — Op Note (Signed)
[facility] Patient Name: Donna Padilla Procedure Date: 08/15/2023 10:33 AM MRN: [id number] Endoscopist: Lynann Bologna , MD, [id number] Age: 67 Referring MD:  Date of Birth: [birth year] Gender: Female Account #: [id number] Procedure:                Colonoscopy Indications:              Screening for colorectal malignant neoplasm. FH CRC                            GM at age 76. Medicines:                Monitored Anesthesia Care Procedure:                Pre-Anesthesia Assessment:                           - Prior to the procedure, a History and Physical                            was performed, and patient medications and                            allergies were reviewed. The patient's tolerance of                            previous anesthesia was also reviewed. The risks                            and benefits of the procedure and the sedation                            options and risks were discussed with the patient.                            All questions were answered, and informed consent                            was obtained. Prior Anticoagulants: The patient has                            taken no anticoagulant or antiplatelet agents. ASA                            Grade Assessment: II - A patient with mild systemic                            disease. After reviewing the risks and benefits,                            the patient was deemed in satisfactory condition to                            undergo the procedure.  After obtaining informed consent, the colonoscope                            was passed under direct vision. Throughout the                            procedure, the patient's blood pressure, pulse, and                            oxygen saturations were monitored continuously. The                            Olympus Scope SN [id number] was introduced through the                            anus and advanced to the 2 cm into  the ileum. The                            colonoscopy was performed without difficulty. The                            patient tolerated the procedure well. The quality                            of the bowel preparation was good. The terminal                            ileum, ileocecal valve, appendiceal orifice, and                            rectum were photographed. Scope In: 10:56:18 AM Scope Out: 11:11:43 AM Scope Withdrawal Time: 0 hours 10 minutes 43 seconds  Total Procedure Duration: 0 hours 15 minutes 25 seconds  Findings:                 Two sessile polyps were found in the mid descending                            colon and cecum. The polyps were 3 to 4 mm in size.                            These polyps were removed with a cold snare.                            Resection and retrieval were complete.                           Many medium-mouthed diverticula were found in the                            sigmoid colon.                           Non-bleeding internal hemorrhoids were found during  retroflexion. The hemorrhoids were small and Grade                            I (internal hemorrhoids that do not prolapse).                           The terminal ileum appeared normal.                           The exam was otherwise without abnormality on                            direct and retroflexion views. Complications:            No immediate complications. Estimated Blood Loss:     Estimated blood loss: none. Impression:               - Two 3 to 4 mm polyps in the mid descending colon                            and in the cecum, removed with a cold snare.                            Resected and retrieved.                           - Moderate sigmoid diverticulosis.                           - Non-bleeding internal hemorrhoids.                           - The examined portion of the ileum was normal.                           - The examination was  otherwise normal on direct                            and retroflexion views. Recommendation:           - Patient has a contact number available for                            emergencies. The signs and symptoms of potential                            delayed complications were discussed with the                            patient. Return to normal activities tomorrow.                            Written discharge instructions were provided to the                            patient.                           -  Resume previous diet.                           - Continue present medications.                           - Await pathology results.                           - Repeat colonoscopy for surveillance based on                            pathology results.                           - The findings and recommendations were discussed                            with the patient's family. Lynann Bologna, MD 08/15/2023 11:17:57 AM This report has been signed electronically.

## 2023-08-15 NOTE — Progress Notes (Signed)
1153- pt c/o feeling nauseated when she sat up to get dressed.  She says she was nauseated pre-procedure.  Spoke with Dr. Chales Abrahams- ok to give Zofran 4 mg po ODT x1 dose.  Given

## 2023-08-16 ENCOUNTER — Telehealth: Payer: Self-pay

## 2023-08-16 NOTE — Telephone Encounter (Signed)
  Follow up Call-     08/15/2023   10:15 AM  Call back number  Post procedure Call Back phone  # [phone]  Permission to leave phone message Yes     Patient questions:  Do you have a fever, pain , or abdominal swelling? No. Pain Score  0 *  Have you tolerated food without any problems? Yes.    Have you been able to return to your normal activities? Yes.    Do you have any questions about your discharge instructions: Diet   No. Medications  No. Follow up visit  No.  Do you have questions or concerns about your Care? No.  Actions: * If pain score is 4 or above: No action needed, pain <4.

## 2023-08-17 LAB — SURGICAL PATHOLOGY

## 2023-08-18 ENCOUNTER — Encounter: Payer: Self-pay | Admitting: Gastroenterology

## 2023-09-16 ENCOUNTER — Other Ambulatory Visit: Payer: Self-pay | Admitting: Physician Assistant

## 2023-09-16 DIAGNOSIS — Z1231 Encounter for screening mammogram for malignant neoplasm of breast: Secondary | ICD-10-CM

## 2023-10-21 ENCOUNTER — Ambulatory Visit
Admission: RE | Admit: 2023-10-21 | Discharge: 2023-10-21 | Disposition: A | Discharge status: Home / Self Care | Payer: Medicare Other | Source: Ambulatory Visit | Attending: Physician Assistant | Admitting: Physician Assistant

## 2023-10-21 DIAGNOSIS — Z1231 Encounter for screening mammogram for malignant neoplasm of breast: Secondary | ICD-10-CM

## 2023-11-03 ENCOUNTER — Other Ambulatory Visit: Payer: Self-pay | Admitting: Nurse Practitioner

## 2024-01-05 ENCOUNTER — Telehealth: Payer: Self-pay | Admitting: Nurse Practitioner

## 2024-01-05 NOTE — Telephone Encounter (Signed)
Left her a message to call us back. 

## 2024-01-05 NOTE — Telephone Encounter (Signed)
 PT is calling to get a refill for omeprazole. She is running out of them before her next refill. She wants to know if the dosage can be changed or if she can have more to accommodate taking twice daily. Requesting a call back. Please advise.

## 2024-01-05 NOTE — Telephone Encounter (Signed)
 I spoke to Donna Padilla and she said after her EGD last Oct the omeprazole was increased to BID for 3 weeks. She went back to once daily and it didn't help so she went back to BID. She is requesting advise as to if she can continue omeprazole 20mg  BID or do a 40mg  every day. She would like a 90 day supply please be sent to [facility] in [place]. Please Advise, thank you.

## 2024-01-06 MED ORDER — OMEPRAZOLE 20 MG PO CPDR: 20.0000 mg | DELAYED_RELEASE_CAPSULE | Freq: Two times a day (BID) | ORAL | 4 refills | Status: DC

## 2024-01-06 NOTE — Telephone Encounter (Signed)
 Per Dr Chales Abrahams I can send in the omeprazole 20mg  for BID dosing, #180, 4 refill. Patient informed and said thank you.

## 2024-05-18 ENCOUNTER — Encounter: Payer: Self-pay | Admitting: Advanced Practice Midwife

## 2024-08-23 ENCOUNTER — Encounter (INDEPENDENT_AMBULATORY_CARE_PROVIDER_SITE_OTHER): Payer: Self-pay

## 2024-09-04 ENCOUNTER — Other Ambulatory Visit: Payer: Self-pay | Admitting: Physician Assistant

## 2024-09-04 DIAGNOSIS — Z1231 Encounter for screening mammogram for malignant neoplasm of breast: Secondary | ICD-10-CM

## 2024-09-18 ENCOUNTER — Encounter (INDEPENDENT_AMBULATORY_CARE_PROVIDER_SITE_OTHER): Payer: Self-pay | Admitting: Otolaryngology

## 2024-09-18 ENCOUNTER — Ambulatory Visit (INDEPENDENT_AMBULATORY_CARE_PROVIDER_SITE_OTHER): Admitting: Otolaryngology

## 2024-09-18 VITALS — BP 135/84 | HR 91 | Temp 97.5°F | Ht 70.0 in | Wt 191.0 lb

## 2024-09-18 DIAGNOSIS — J329 Chronic sinusitis, unspecified: Secondary | ICD-10-CM | POA: Diagnosis not present

## 2024-09-18 MED ORDER — CLINDAMYCIN HCL 300 MG PO CAPS: 300.0000 mg | ORAL_CAPSULE | Freq: Three times a day (TID) | ORAL | 0 refills | Status: AC

## 2024-09-18 NOTE — Progress Notes (Signed)
 Reason for Consult: Chronic sinusitis Referring Physician: Dr. Junette Arland [name] is an 68 y.o. female.  HPI: She is here for evaluation of right sided maxillary sinus infection.  This was noted on her CT scan that she had while she was having dizziness a few weeks ago.  She has had chronic pressure in the right cheek sinus and longstanding drainage. Purulent nasal drainage. She has had pressure in her ear for years.  Sometimes she feels a foul odor in the nose. She was tried on Keflex. She has not had a lot of antibiotics.   Past Medical History:  Diagnosis Date   Arthritis    GERD (gastroesophageal reflux disease)     a little   Goiter    on meds   HSV-1 infection    Hypothyroidism    Osteoporosis    not on meds at this time (03/23/2023)   PONV (postoperative nausea and vomiting)    Rheumatoid arthritis (HCC)    on meds   Simple endometrial hyperplasia without atypia 09/2007   recommended f/u bx in one year/Biopsy in 12/2008 was benign.    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   Procedure: TOTAL KNEE ARTHROPLASTY;  Surgeon: Melodi Lerner, MD;  Location: [facility];  Service: Orthopedics;  Laterality: Right;   WISDOM TOOTH EXTRACTION      Family History  Problem Relation Age of Onset   Atrial fibrillation Mother    Transient ischemic attack Mother    Prostate cancer Father    Esophageal cancer Father 4       smoker until age 38   Diabetes Brother    Colon cancer Maternal Grandmother        dx age 66   Diabetes Maternal Grandfather    Pancreatic cancer Maternal Aunt    Colon polyps Neg Hx    Crohn's disease Neg Hx    Rectal cancer Neg Hx    Ulcerative colitis Neg Hx    Stomach cancer Neg Hx      Social History:  reports that she has quit smoking. Her smoking use included cigarettes. She has been exposed to tobacco smoke. She has never used smokeless tobacco. She reports that she does not currently use alcohol. She reports that she does not use drugs.  Allergies: No Known Allergies  Medications: I have reviewed the patient's current medications.  No results found for this or any previous visit (from the past 48 hours).  No results found.  ROS Last menstrual period 09/01/2010. Physical Exam Constitutional:      Appearance: Normal appearance.  HENT:     Head: Normocephalic and atraumatic.     Right Ear: Tympanic membrane is without lesions and middle ear aerated, ear canal and external ear normal.     Left Ear: Tympanic membrane is without lesions and middle ear aerated, ear canal and external ear normal.     Nose: Nose normal. Turbinates with mild hypertrophy, No significant swelling or masses.     Oral cavity/oropharynx: Mucous membranes are moist. No lesions or masses    Larynx: normal voice. Mirror attempted without success    Eyes:     Extraocular Movements:  Extraocular movements intact.     Conjunctiva/sclera: Conjunctivae normal.     Pupils: Pupils are equal, round, and reactive to light.  Cardiovascular:     Rate and Rhythm: Normal rate.  Pulmonary:     Effort: Pulmonary effort is normal.  Musculoskeletal:     Cervical back: Normal range of motion and neck supple. No rigidity.  Lymphadenopathy:     Cervical: No cervical adenopathy or masses.salivary glands without lesions. .  Neurological:     Mental Status: He is alert. CN 2-12 intact. No nystagmus  Nasal endoscopy-risk, benefits, and options were all discussed.  All her questions were answered and consent was obtained.  The 0 degree scope was inserted in the left side and there is a little bit of mucus but the nasal lining looks normal and there is no purulence.  The right side has purulent material in the  middle meatus and significant thickening.  No polyps.  There is purulence coming from underneath the inferior turbinate which is also grossly hypertrophied.  The scope was removed without difficulty.  She tolerated well.    Assessment/Plan: Chronic sinusitis- I cannot see the CT scan from [place]. 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 This needs another antibiotic and clindamycin called in. Will need to review ct but likely need a stealth ct here because will most likely need surgery.   Norleen Notice 09/18/2024, 3:22 PM

## 2024-09-19 ENCOUNTER — Telehealth (INDEPENDENT_AMBULATORY_CARE_PROVIDER_SITE_OTHER): Payer: Self-pay

## 2024-09-19 NOTE — Telephone Encounter (Signed)
 Pt LVM that the hospital in [place] sent her images from a CT scan of head done there sent into [facility]. I called our reading room to make sure they can be seen. Reading room told me to get Dr. Roark to put in an Outside order for images so they can be pushed to be seen in chart.

## 2024-09-20 ENCOUNTER — Other Ambulatory Visit: Payer: Self-pay | Admitting: Nurse Practitioner

## 2024-09-20 NOTE — Telephone Encounter (Signed)
 Refill sent to pharmacy. OV scheduled for 1/21 at 8:30 am with Dr. Charlanne.

## 2024-09-21 ENCOUNTER — Telehealth (INDEPENDENT_AMBULATORY_CARE_PROVIDER_SITE_OTHER): Payer: Self-pay

## 2024-09-21 ENCOUNTER — Other Ambulatory Visit (INDEPENDENT_AMBULATORY_CARE_PROVIDER_SITE_OTHER): Payer: Self-pay | Admitting: Otolaryngology

## 2024-09-21 ENCOUNTER — Inpatient Hospital Stay
Admission: RE | Admit: 2024-09-21 | Discharge: 2024-09-21 | Disposition: A | Discharge status: Home / Self Care | Payer: Self-pay | Source: Ambulatory Visit | Attending: Otolaryngology | Admitting: Otolaryngology

## 2024-09-21 DIAGNOSIS — J329 Chronic sinusitis, unspecified: Secondary | ICD-10-CM

## 2024-09-21 NOTE — Telephone Encounter (Signed)
 Called patient back regarding voicemail she left. I explained to her that she should be able to take the clindamycin  (CLEOCIN ) 300 MG capsule  with the hydroxychloroquine (PLAQUENIL) 200 MG tablet assuming that Dr. Roark has already reviewed her medications.  I told her to check with her pharmacist about any interactions. Patient understood. Patient also trying to get scan pushed into [facility]. I explained that if that doesn't work that she can always go get a disc made of the CT scan and bring it in or have it mailed. Patient understood.

## 2024-09-21 NOTE — Progress Notes (Signed)
 I placed order for outside imaging

## 2024-09-24 ENCOUNTER — Telehealth (INDEPENDENT_AMBULATORY_CARE_PROVIDER_SITE_OTHER): Payer: Self-pay

## 2024-09-24 NOTE — Telephone Encounter (Signed)
 Called patient to let them know that Dr. Roark stated to stop taking the medication and to take benadryl  and that he will not prescribe her anything else until the reaction is out of her system patient understood.

## 2024-10-08 ENCOUNTER — Telehealth (INDEPENDENT_AMBULATORY_CARE_PROVIDER_SITE_OTHER): Payer: Self-pay | Admitting: Otolaryngology

## 2024-10-08 NOTE — Telephone Encounter (Signed)
 The patient called in, her whole chest has a rash on it still. Her symptoms are not gone yet. she has been taking benedyl, but only at night.  She  states that the rash is starting to dry up, but still very itchy.  She called to move her appointment from tomorrow out due to weather.  Please advise.

## 2024-10-09 ENCOUNTER — Ambulatory Visit (INDEPENDENT_AMBULATORY_CARE_PROVIDER_SITE_OTHER): Admitting: Otolaryngology

## 2024-10-09 NOTE — Telephone Encounter (Signed)
 Called patient to follow up with how she was feeling today, she stated she is trying a steroid cream on top of benadryl  I asked patient to call us  back Friday to follow up on if it is working because if not I would talk to Dr. Roark about another form of treatment.

## 2024-10-10 ENCOUNTER — Ambulatory Visit (INDEPENDENT_AMBULATORY_CARE_PROVIDER_SITE_OTHER): Admitting: Otolaryngology

## 2024-10-10 VITALS — BP 124/80 | HR 74 | Temp 97.3°F

## 2024-10-10 DIAGNOSIS — J320 Chronic maxillary sinusitis: Secondary | ICD-10-CM

## 2024-10-10 DIAGNOSIS — J329 Chronic sinusitis, unspecified: Secondary | ICD-10-CM

## 2024-10-10 MED ORDER — DOXYCYCLINE HYCLATE 100 MG PO TABS: 100.0000 mg | ORAL_TABLET | Freq: Two times a day (BID) | ORAL | 0 refills | Status: AC

## 2024-10-10 NOTE — Progress Notes (Signed)
 Reason for Consult: Follow-up Referring Physician: Dr. Honora Arland [name] is an 68 y.o. female.  HPI: She did not react well to the clindamycin  with a rash.  She stopped the medication after 3 days.  She still has the pressure and pain in the right maxillary sinus.  I still have not been able to see the CT scan from [place].  Past Medical History:  Diagnosis Date   Arthritis    GERD (gastroesophageal reflux disease)     a little   Goiter    on meds   HSV-1 infection    Hypothyroidism    Osteoporosis    not on meds at this time (03/23/2023)   PONV (postoperative nausea and vomiting)    Rheumatoid arthritis (HCC)    on meds   Simple endometrial hyperplasia without atypia 09/2007   recommended f/u bx in one year/Biopsy in 12/2008 was benign.    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   Procedure: TOTAL KNEE ARTHROPLASTY;  Surgeon: Melodi Lerner, MD;  Location: [facility];  Service: Orthopedics;  Laterality: Right;   WISDOM TOOTH EXTRACTION      Family History  Problem Relation Age of Onset   Atrial fibrillation Mother    Transient ischemic attack Mother    Prostate cancer Father    Esophageal cancer Father 86       smoker until age 57   Diabetes Brother    Colon cancer Maternal Grandmother        dx age 67   Diabetes Maternal Grandfather    Pancreatic cancer Maternal Aunt    Colon polyps Neg Hx    Crohn's disease Neg Hx    Rectal cancer Neg Hx    Ulcerative colitis Neg Hx    Stomach cancer Neg Hx     Social History:  reports that she has quit smoking. Her smoking use included cigarettes. She has been exposed to tobacco smoke. She has never used smokeless tobacco. She reports that she does not  currently use alcohol. She reports that she does not use drugs.  Allergies: No Known Allergies  Medications: I have reviewed the patient's current medications.  No results found for this or any previous visit (from the past 48 hours).  No results found.  ROS Blood pressure 124/80, pulse 74, temperature (!) 97.3 F (36.3 C), last menstrual period 09/01/2010, SpO2 96%. Physical Exam Constitutional:      Appearance: Normal appearance.  HENT:     Head: Normocephalic and atraumatic.     Right Ear: Tympanic membrane is without lesions and middle ear aerated, ear canal and external ear normal.     Left Ear: Tympanic membrane is without lesions and middle ear aerated, ear canal and external ear normal.     Nose: Nose see below    Oral cavity/oropharynx: Mucous membranes are moist. No lesions or masses    Larynx: normal voice. Mirror attempted without success    Eyes:     Extraocular Movements: Extraocular movements intact.     Conjunctiva/sclera: Conjunctivae normal.     Pupils: Pupils are equal, round, and reactive to light.  Cardiovascular:     Rate and Rhythm:  Normal rate.  Pulmonary:     Effort: Pulmonary effort is normal.  Musculoskeletal:     Cervical back: Normal range of motion and neck supple. No rigidity.  Lymphadenopathy:     Cervical: No cervical adenopathy or masses.salivary glands without lesions. .  Neurological:     Mental Status: He is alert. CN 2-12 intact. No nystagmus   Nasal endoscopy  We talked about the risk, benefits, and options.  All her questions were answered and consent was obtained.  The scope was passed into the left side in the middle meatus looked clear and the middle turbinate normal.  Right side is very swollen and the middle turbinate is congested with purulence draining out of the middle meatus down along the inferior turbinate.  The inferior turbinate is hypertrophied.  She tolerated the scope well.   Assessment/Plan: Right maxillary  sinusitis-right now I do not know if just the maxillary sinus is involved but certainly she is infected.  There is pus draining from the sinus by nasal endoscopy.  I started her on doxycycline as she has had that previously and will gena get a fusion CT scan with the anticipation she is going to need surgery.  She will follow-up with Dr. Greggory when the CT is done to discuss endoscopic sinus surgery.  Norleen Notice 10/10/2024, 11:37 AM

## 2024-10-22 ENCOUNTER — Inpatient Hospital Stay
Admission: RE | Admit: 2024-10-22 | Discharge: 2024-10-22 | Attending: Physician Assistant | Admitting: Physician Assistant

## 2024-10-22 DIAGNOSIS — Z1231 Encounter for screening mammogram for malignant neoplasm of breast: Secondary | ICD-10-CM

## 2024-10-24 ENCOUNTER — Encounter (INDEPENDENT_AMBULATORY_CARE_PROVIDER_SITE_OTHER): Payer: Self-pay

## 2024-10-29 ENCOUNTER — Ambulatory Visit (INDEPENDENT_AMBULATORY_CARE_PROVIDER_SITE_OTHER)

## 2024-11-02 ENCOUNTER — Ambulatory Visit (HOSPITAL_COMMUNITY)
Admission: RE | Admit: 2024-11-02 | Discharge: 2024-11-02 | Disposition: A | Discharge status: Home / Self Care | Source: Ambulatory Visit | Attending: Otolaryngology | Admitting: Otolaryngology

## 2024-11-02 DIAGNOSIS — J329 Chronic sinusitis, unspecified: Secondary | ICD-10-CM | POA: Diagnosis present

## 2024-11-06 ENCOUNTER — Ambulatory Visit (INDEPENDENT_AMBULATORY_CARE_PROVIDER_SITE_OTHER): Admitting: Otolaryngology

## 2024-11-08 ENCOUNTER — Ambulatory Visit (INDEPENDENT_AMBULATORY_CARE_PROVIDER_SITE_OTHER)

## 2024-11-08 ENCOUNTER — Encounter (INDEPENDENT_AMBULATORY_CARE_PROVIDER_SITE_OTHER): Payer: Self-pay

## 2024-11-08 VITALS — BP 130/85 | HR 82

## 2024-11-08 DIAGNOSIS — J322 Chronic ethmoidal sinusitis: Secondary | ICD-10-CM

## 2024-11-08 DIAGNOSIS — J320 Chronic maxillary sinusitis: Secondary | ICD-10-CM

## 2024-11-08 DIAGNOSIS — J343 Hypertrophy of nasal turbinates: Secondary | ICD-10-CM

## 2024-11-08 DIAGNOSIS — H8112 Benign paroxysmal vertigo, left ear: Secondary | ICD-10-CM

## 2024-11-08 DIAGNOSIS — R1310 Dysphagia, unspecified: Secondary | ICD-10-CM

## 2024-11-08 NOTE — Progress Notes (Addendum)
 Dear Dr. Honora, Here is my assessment for our mutual patient, Donna Padilla. Thank you for allowing me the opportunity to care for your patient. Please do not hesitate to contact me should you have any other questions. Sincerely, Dr. Hadassah Parody  Otolaryngology Clinic Note Referring provider: Dr. Honora HPI:   Initial HPI (11/08/2024) 69 year old female here for follow-up on right maxillary sinusitis.  Saw Norleen Notice, ENT on 10/10/2024 who recommended CT scan of her sinuses given her symptoms.  She returns today with a CT scan.  She reports constant, chronic and worsening right sided facial pain and pressure.  Feels persistent right sided facial pressure fullness and tenderness for at least 2 years.  Feels like the area of her face is puffy.  Feels like her secretions are very viscous and tenacious.  She underwent TMJ surgery 1996 with what sounds like maxillary and mandibular osteotomies, placement of screws and bone grafting in the palate.  She has persistent numbness in her lower lip since then.  Additionally she has had bouts of positional vertigo.  Vertigo is provoked by abrupt abrupt movements such as looking down or turning over in bed.  Finally she wanted to bring up that she has had some dysphagia and wonders if this is due to her thyroidectomy for large goiter.  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  She does not think that she is aspirating any food.  No episodes of choking or dyspnea when she is eating.   Independent Review of Additional Tests or Records:  CT sinus 1-26 independently reviewed showing total opacification of the right maxillary sinus with dual densities, extending into the anterior ethmoid cells.  Hardware from her prior osteotomies are present in the maxillary sinus wall.  Bilateral IT hypertrophy. Septum is largely midline     PMH/Meds/All/SocHx/FamHx/ROS:   Past Medical History:  Diagnosis Date   Arthritis    GERD  (gastroesophageal reflux disease)     a little   Goiter    on meds   HSV-1 infection    Hypothyroidism    Osteoporosis    not on meds at this time (03/23/2023)   PONV (postoperative nausea and vomiting)    Rheumatoid arthritis (HCC)    on meds   Simple endometrial hyperplasia without atypia 09/2007   recommended f/u bx in one year/Biopsy in 12/2008 was benign.     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   Procedure: TOTAL KNEE ARTHROPLASTY;  Surgeon: Melodi Lerner, MD;  Location: [facility];  Service: Orthopedics;  Laterality: Right;   WISDOM TOOTH EXTRACTION      Family History  Problem Relation Age of Onset   Atrial fibrillation Mother    Transient ischemic attack Mother    Prostate cancer Father    Esophageal cancer Father 69       smoker until age 16   Diabetes Brother    Colon cancer Maternal Grandmother        dx age 46   Diabetes Maternal Grandfather    Pancreatic cancer Maternal Aunt    Colon polyps Neg Hx    Crohn's disease Neg Hx    Rectal  cancer Neg Hx    Ulcerative colitis Neg Hx    Stomach cancer Neg Hx      Social Connections: Not on file     Current Outpatient Medications  Medication Instructions   amoxicillin (AMOXIL) 500 MG tablet SMARTSIG:4 Tablet(s) By Mouth   hydroxychloroquine (PLAQUENIL) 400 mg, Daily   ibuprofen (ADVIL) 800 mg, Every 6 hours PRN   levothyroxine  (SYNTHROID ) 125 MCG tablet 1 tablet, Daily   meloxicam (MOBIC) 15 mg, Oral, Daily   omeprazole  (PRILOSEC) 20 MG capsule Oral, Daily   pseudoephedrine (SUDAFED) 30 mg, As needed   valACYclovir  (VALTREX ) 1000 MG tablet Take 2 tablets po q 12 hours x 2     Physical Exam:   BP 130/85 (BP Location: Right Arm, Patient  Position: Sitting)   Pulse 82   LMP 09/01/2010   SpO2 99%   Salient findings:  CN II-XII intact    Bilateral EAC clear and TM intact with well pneumatized middle ear spaces  Anterior rhinoscopy: Septum midline, bilateral IT hypertrophy  Nasal endoscopy was indicated to better evaluate the nose and paranasal sinuses, given the patient's history and exam findings, and is detailed below.  No lesions of oral cavity/oropharynx  No obviously palpable neck masses/lymphadenopathy/thyromegaly  No respiratory distress or stridor  Seprately Identifiable Procedures:  Prior to initiating any procedures, risks/benefits/alternatives were explained to the patient and verbal consent obtained. none   Impression & Plans:  Donna Padilla is a 69 y.o. female with   1. Chronic right maxillary sinusitis   2. Benign paroxysmal positional vertigo of left ear   3. Chronic ethmoidal sinusitis   4. Hypertrophy of both inferior nasal turbinates   5. Dysphagia, unspecified type    Assessment and Plan Assessment & Plan Chronic right maxillary and ethmoid sinusitis with possible fungal ball Chronic right maxillary and ethmoid sinusitis with CT evidence of right maxillary sinus opacification and possible fungal ball given dual densities, resulting in longstanding facial pressure, tenderness, and headaches. The condition is refractory to conservative management.  Unclear if this is due to her prior TMJ surgery and osteotomies.  She would require surgical intervention for opening of her sinus and evacuation of the infection.  Anticipated outcome is symptomatic improvement.  - Will plan for endoscopic sinus surgery to open the right maxillary and right anterior ethmoid sinuses, remove obstructive material via transnasal endoscopic approach - Provided anticipatory guidance regarding postoperative care, including sinus irrigations to clear debris and expected oozing for several days. We discussed the goals of  sinus surgery, and expectations for postoperative management. We discussed R/B/A including pain, infection, bleeding (~5% risk of operative visit for control), persistent symptoms, need for revision surgery, and other risks including damage to the eye and loss of vision (very low risk), and injury to skull base with risk of CSF leak and additional intracranial complications (very low risk given area of concern and operative field), anesthetic complications, among others.  Patient understands and is ready to proceed.   Benign paroxysmal positional vertigo, left ear Episodic vertigo with positional changes, consistent with benign paroxysmal positional vertigo of the left ear. Symptoms have been present since at least November and are bothersome. - Referred to vestibular rehabilitation for evaluation and treatment, including canalith repositioning maneuvers.  Dysphagia She does not feel as though she is aspirating.  Told her to keep an eye on this and take know if she was having choking episodes or coughing episodes after eating.  May require swallow study if she continues to  have issues with this.  She will think about this and let me know if she wants to proceed   See below regarding exact medications prescribed this encounter including dosages and route: No orders of the defined types were placed in this encounter.     Thank you for allowing me the opportunity to care for your patient. Please do not hesitate to contact me should you have any other questions.  Sincerely, Hadassah Parody, MD Otolaryngologist (ENT), [facility] Phone: [phone] Fax: [phone]  MDM:  Level 4 Complexity/Problems addressed: 4-chronic worsening problem Data complexity: 4-  independent review of CT scan - Morbidity: 4- discussed surgery    - Prescription Drug prescribed or managed: no

## 2024-11-19 ENCOUNTER — Telehealth (INDEPENDENT_AMBULATORY_CARE_PROVIDER_SITE_OTHER): Payer: Self-pay | Admitting: Otolaryngology

## 2024-11-19 NOTE — Telephone Encounter (Signed)
 She is set up with Dr greggory for surgery

## 2024-11-21 ENCOUNTER — Ambulatory Visit: Admitting: Gastroenterology

## 2024-11-27 NOTE — Progress Notes (Signed)
 "  69 y.o. [id number] postmenopausal female with osteoporosis, oral HSV here for breast and pelvic exam. Widowed. Retired. PCP: Honora Pao, PA-C   She reports she is sexually active with new partner, noticing some vaginal dryness. She has since had a UTI, wants urine checked to ensure it has resolved. Having ENT surgery this month due to impacted sinus, also dealing with vertigo. Request TSH testing, needs updated 2/2 recent medication change with PCP. Urine sample provided: No  Postmenopausal bleeding: none Pelvic discharge or pain: none Breast mass, nipple discharge or skin changes : none Sexually active: Yes   Last PAP:     Component Value Date/Time   DIAGPAP (A) 08/25/2022 1141    - Atypical squamous cells of undetermined significance (ASC-US )   HPVHIGH Negative 08/25/2022 1141   ADEQPAP  08/25/2022 1141    Satisfactory for evaluation; transformation zone component PRESENT.   Last mammogram: 10/22/24 Birads 1, density B Last DXA: 09/01/22 T-score -2.7 at right femur Last colonoscopy: 08/15/23 q7y  Exercising: Yes, walk Smoker:No  [employer] Office Visit from 12/06/2024 in [facility]  PHQ-2 Total Score 0      GYN HISTORY: None  OB History  Gravida Para Term Preterm AB Living  2 2 2   0 2  SAB IAB Ectopic Multiple Live Births      2    # Outcome Date GA Lbr Len/2nd Weight Sex Type Anes PTL Lv  2 Term      Vag-Spont   LIV  1 Term      Vag-Spont   LIV   Past Medical History:  Diagnosis Date   Arthritis    GERD (gastroesophageal reflux disease)     a little   Goiter    on meds   HSV-1 infection    Hypothyroidism    Osteoporosis    not on meds at this time (03/23/2023)   PONV (postoperative nausea and vomiting)    Rheumatoid arthritis (HCC)    on meds   Simple endometrial hyperplasia without atypia 09/2007   recommended f/u bx in one year/Biopsy in 12/2008 was benign.   Past Surgical History:  Procedure Laterality Date    APPENDECTOMY  1970   BREAST BIOPSY Right 1972   Lump removed-benign   DILATION AND CURETTAGE OF UTERUS     HYSTEROSCOPY  09/2001   with D&C, Polyp   THYROIDECTOMY  2006   benign   TMJ ARTHROPLASTY  1994   TOTAL KNEE ARTHROPLASTY Right 02/23/2021   Procedure: TOTAL KNEE ARTHROPLASTY;  Surgeon: Melodi Lerner, MD;  Location: [facility];  Service: Orthopedics;  Laterality: Right;   WISDOM TOOTH EXTRACTION     Medications Ordered Prior to Encounter[1] Social History   Socioeconomic History   Marital status: Widowed    Spouse name: Not on file   Number of children: 2   Years of education: Not on file   Highest education level: Not on file  Occupational History   Occupation: retired  Tobacco Use   Smoking status: Former    Types: Cigarettes    Passive exposure: Past (AT WORK IN THE PAST)   Smokeless tobacco: Never  Vaping Use   Vaping status: Never Used  Substance and Sexual Activity   Alcohol use: Not Currently    Alcohol/week: 0.0 standard drinks of alcohol   Drug use: No   Sexual activity: Yes    Birth control/protection: Other-see comments, Post-menopausal    Comment: vasectomy-1st intercourse 40 yo-1 partner  Other  Topics Concern   Not on file  Social History Narrative   Not on file   Social Drivers of Health   Tobacco Use: Medium Risk (12/06/2024)   Patient History    Smoking Tobacco Use: Former    Smokeless Tobacco Use: Never    Passive Exposure: Past  Programmer, Applications: Not on [employer] Insecurity: Not on file  Transportation Needs: Not on file  Physical Activity: Not on file  Stress: Not on file  Social Connections: Not on file  Intimate Partner Violence: Not on file  Depression (PHQ2-9): Low Risk (12/06/2024)   Depression (PHQ2-9)    PHQ-2 Score: 0  Alcohol Screen: Not on file  Housing: Not on file  Utilities: Not on file  Health Literacy: Not on file   Family History  Problem Relation Age of Onset   Atrial fibrillation Mother    Transient  ischemic attack Mother    Prostate cancer Father    Esophageal cancer Father 42       smoker until age 32   Diabetes Brother    Colon cancer Maternal Grandmother        dx age 78   Diabetes Maternal Grandfather    Pancreatic cancer Maternal Aunt    Colon polyps Neg Hx    Crohn's disease Neg Hx    Rectal cancer Neg Hx    Ulcerative colitis Neg Hx    Stomach cancer Neg Hx    Allergies[2]    PE Today's Vitals   12/06/24 1045  BP: 116/72  Pulse: 77  Temp: 97.7 F (36.5 C)  TempSrc: Oral  SpO2: 96%  Weight: 196 lb (88.9 kg)  Height: 5' 8.5 (1.74 m)   Body mass index is 29.37 kg/m.  Physical Exam Vitals reviewed. Exam conducted with a chaperone present.  Constitutional:      General: She is not in acute distress.    Appearance: Normal appearance.  HENT:     Head: Normocephalic and atraumatic.     Nose: Nose normal.  Eyes:     Extraocular Movements: Extraocular movements intact.     Conjunctiva/sclera: Conjunctivae normal.  Pulmonary:     Effort: Pulmonary effort is normal.  Chest:     Chest wall: No mass or tenderness.  Breasts:    Right: Normal. No swelling, mass, nipple discharge, skin change or tenderness.     Left: Normal. No swelling, mass, nipple discharge, skin change or tenderness.  Abdominal:     General: There is no distension.     Palpations: Abdomen is soft.     Tenderness: There is no abdominal tenderness.  Genitourinary:    General: Normal vulva.     Exam position: Lithotomy position.     Urethra: No prolapse.     Vagina: Normal. No vaginal discharge or bleeding.     Cervix: Normal. No lesion.     Uterus: Normal. Not enlarged and not tender.      Adnexa: Right adnexa normal and left adnexa normal.  Musculoskeletal:        General: Normal range of motion.     Cervical back: Normal range of motion.  Lymphadenopathy:     Upper Body:     Right upper body: No axillary adenopathy.     Left upper body: No axillary adenopathy.     Lower Body: No  right inguinal adenopathy. No left inguinal adenopathy.  Skin:    General: Skin is warm and dry.  Neurological:     General:  No focal deficit present.     Mental Status: She is alert.  Psychiatric:        Mood and Affect: Mood normal.        Behavior: Behavior normal.      Assessment and Plan:        Encounter for breast and pelvic examination Assessment & Plan: Cervical cancer screening performed according to ASCCP guidelines. Encouraged annual mammogram screening Colonoscopy UTD DXA due Labs and immunizations with her primary Encouraged safe sexual practices as indicated Encouraged healthy lifestyle practices with diet and exercise For patients under 50-70yo, I recommend 1200mg  calcium daily and 600IU of vitamin D  daily.    H/O cold sores -     valACYclovir  HCl; Take 2 tablets po q 12 hours x 2  Dispense: 30 tablet; Refill: 1  History of UTI -     Urinalysis,Complete w/RFL Culture  Cervical cancer screening -     Cytology - PAP  Genitourinary syndrome of menopause Assessment & Plan: Reviewed safety profile of low dose vaginal estrogen, however reviewed that higher doses have been associated with DVT, breast and uterine cancer.   Also recommend coconut oil for daily moisture and uses a lubricant  Orders: -     Estradiol ; Apply 0.5g to vulva nightly for 2 weeks then 2 times a week. Do not use applicator.  Dispense: 42.5 g; Refill: 1  Screen for STD (sexually transmitted disease) -     Cytology - PAP -     Hepatitis C antibody -     RPR W/RFLX TO RPR TITER, TREPONEMAL AB, SCREEN AND DIAGNOSIS -     HIV Antibody (routine testing w rflx)  Age-related osteoporosis without current pathological fracture Assessment & Plan: Bone density report discussed.  Risk factors assessed: Menopause Counseled regarding osteoporosis, risk factors, treatment options. Discussed importance of weight bearing exercise, daily calcium 1200 mg daily and vit D, and fall risk reduction. Will  complete work-up for secondary osteoporosis.  Patient does understand that osteoporosis is a silent disease until fracture occurs and understands that there is significant morbidity associated with osteoporotic fracture.   Recommend updated imaging and f/u to discuss treatment. Consider prolia vs evenity.  Orders: -     DG Bone Density; Future -     Phosphorus -     Magnesium -     PTH, intact and calcium -     VITAMIN D  25 Hydroxy (Vit-D Deficiency, Fractures)  Hypothyroidism, unspecified type -     TSH  Encounter for screening for human immunodeficiency virus (HIV) -     HIV Antibody (routine testing w rflx)  Other problems related to lifestyle -     HIV Antibody (routine testing w rflx)  Negative depression screening   Vera LULLA Pa, MD       [1]  Current Outpatient Medications on File Prior to Visit  Medication Sig Dispense Refill   amoxicillin (AMOXIL) 500 MG tablet SMARTSIG:4 Tablet(s) By Mouth (Patient taking differently: As needed for dental procedures)     hydroxychloroquine (PLAQUENIL) 200 MG tablet Take 400 mg by mouth daily.     ibuprofen (ADVIL) 800 MG tablet Take 800 mg by mouth every 6 (six) hours as needed for mild pain, headache, fever, moderate pain or cramping.     levothyroxine  (SYNTHROID ) 137 MCG tablet Take 137 mcg by mouth daily.     omeprazole  (PRILOSEC) 20 MG capsule TAKE 1 CAPSULE BY MOUTH EVERY DAY 90 capsule 0   pseudoephedrine (SUDAFED) 30  MG tablet Take 30 mg by mouth as needed for congestion. Take as needed     meclizine (ANTIVERT) 25 MG tablet Take 25 mg by mouth every 8 (eight) hours as needed.     No current facility-administered medications on file prior to visit.  [2]  Allergies Allergen Reactions   Clindamycin  Dermatitis   "

## 2024-11-30 ENCOUNTER — Ambulatory Visit: Admitting: Physical Therapy

## 2024-12-06 ENCOUNTER — Ambulatory Visit (INDEPENDENT_AMBULATORY_CARE_PROVIDER_SITE_OTHER): Admitting: Obstetrics and Gynecology

## 2024-12-06 ENCOUNTER — Ambulatory Visit: Admitting: Gastroenterology

## 2024-12-06 ENCOUNTER — Ambulatory Visit: Payer: Self-pay | Admitting: Obstetrics and Gynecology

## 2024-12-06 ENCOUNTER — Encounter: Payer: Self-pay | Admitting: Gastroenterology

## 2024-12-06 ENCOUNTER — Encounter: Payer: Self-pay | Admitting: Obstetrics and Gynecology

## 2024-12-06 VITALS — BP 116/72 | HR 77 | Temp 97.7°F | Ht 68.5 in | Wt 196.0 lb

## 2024-12-06 VITALS — BP 128/76 | HR 84 | Ht 69.0 in | Wt 196.0 lb

## 2024-12-06 DIAGNOSIS — B009 Herpesviral infection, unspecified: Secondary | ICD-10-CM | POA: Diagnosis not present

## 2024-12-06 DIAGNOSIS — Z9189 Other specified personal risk factors, not elsewhere classified: Secondary | ICD-10-CM | POA: Diagnosis not present

## 2024-12-06 DIAGNOSIS — M810 Age-related osteoporosis without current pathological fracture: Secondary | ICD-10-CM | POA: Diagnosis not present

## 2024-12-06 DIAGNOSIS — E039 Hypothyroidism, unspecified: Secondary | ICD-10-CM | POA: Insufficient documentation

## 2024-12-06 DIAGNOSIS — K219 Gastro-esophageal reflux disease without esophagitis: Secondary | ICD-10-CM

## 2024-12-06 DIAGNOSIS — Z7289 Other problems related to lifestyle: Secondary | ICD-10-CM

## 2024-12-06 DIAGNOSIS — N958 Other specified menopausal and perimenopausal disorders: Secondary | ICD-10-CM | POA: Insufficient documentation

## 2024-12-06 DIAGNOSIS — Z87440 Personal history of urinary (tract) infections: Secondary | ICD-10-CM | POA: Diagnosis not present

## 2024-12-06 DIAGNOSIS — Z113 Encounter for screening for infections with a predominantly sexual mode of transmission: Secondary | ICD-10-CM | POA: Diagnosis not present

## 2024-12-06 DIAGNOSIS — Z114 Encounter for screening for human immunodeficiency virus [HIV]: Secondary | ICD-10-CM

## 2024-12-06 DIAGNOSIS — Z124 Encounter for screening for malignant neoplasm of cervix: Secondary | ICD-10-CM

## 2024-12-06 DIAGNOSIS — K648 Other hemorrhoids: Secondary | ICD-10-CM | POA: Diagnosis not present

## 2024-12-06 DIAGNOSIS — Z8619 Personal history of other infectious and parasitic diseases: Secondary | ICD-10-CM

## 2024-12-06 DIAGNOSIS — Z01419 Encounter for gynecological examination (general) (routine) without abnormal findings: Secondary | ICD-10-CM | POA: Insufficient documentation

## 2024-12-06 DIAGNOSIS — Z1331 Encounter for screening for depression: Secondary | ICD-10-CM | POA: Diagnosis not present

## 2024-12-06 LAB — URINALYSIS, COMPLETE W/RFL CULTURE
Bacteria, UA: NONE SEEN /HPF
Bilirubin Urine: NEGATIVE
Glucose, UA: NEGATIVE
Hgb urine dipstick: NEGATIVE
Hyaline Cast: NONE SEEN /LPF
Ketones, ur: NEGATIVE
Leukocyte Esterase: NEGATIVE
Nitrites, Initial: NEGATIVE
Protein, ur: NEGATIVE
RBC / HPF: NONE SEEN /HPF (ref 0–2)
Specific Gravity, Urine: 1.015 (ref 1.001–1.035)
WBC, UA: NONE SEEN /HPF (ref 0–5)
pH: 5.5 (ref 5.0–8.0)

## 2024-12-06 LAB — NO CULTURE INDICATED

## 2024-12-06 MED ORDER — VALACYCLOVIR HCL 1 G PO TABS: ORAL_TABLET | ORAL | 1 refills | Status: AC

## 2024-12-06 MED ORDER — ESTRADIOL 0.01 % VA CREA: TOPICAL_CREAM | VAGINAL | 1 refills | Status: AC

## 2024-12-06 MED ORDER — OMEPRAZOLE 20 MG PO CPDR: 20.0000 mg | DELAYED_RELEASE_CAPSULE | Freq: Every day | ORAL | 3 refills | Status: AC

## 2024-12-06 NOTE — Patient Instructions (Signed)
 For patients under 50-70yo, I recommend 1200mg  calcium  daily and 600IU of vitamin D daily. For patients over 70yo, I recommend 1200mg  calcium  daily and 800IU of vitamin D daily.  Health Maintenance, Female Adopting a healthy lifestyle and getting preventive care are important in promoting health and wellness. Ask your health care provider about: The right schedule for you to have regular tests and exams. Things you can do on your own to prevent diseases and keep yourself healthy. What should I know about diet, weight, and exercise? Eat a healthy diet  Eat a diet that includes plenty of vegetables, fruits, low-fat dairy products, and lean protein. Do not eat a lot of foods that are high in solid fats, added sugars, or sodium. Maintain a healthy weight Body mass index (BMI) is used to identify weight problems. It estimates body fat based on height and weight. Your health care provider can help determine your BMI and help you achieve or maintain a healthy weight. Get regular exercise Get regular exercise. This is one of the most important things you can do for your health. Most adults should: Exercise for at least 150 minutes each week. The exercise should increase your heart rate and make you sweat (moderate-intensity exercise). Do strengthening exercises at least twice a week. This is in addition to the moderate-intensity exercise. Spend less time sitting. Even light physical activity can be beneficial. Watch cholesterol and blood lipids Have your blood tested for lipids and cholesterol at 70 years of age, then have this test every 5 years. Have your cholesterol levels checked more often if: Your lipid or cholesterol levels are high. You are older than 70 years of age. You are at high risk for heart disease. What should I know about cancer screening? Depending on your health history and family history, you may need to have cancer screening at various ages. This may include screening  for: Breast cancer. Cervical cancer. Colorectal cancer. Skin cancer. Lung cancer. What should I know about heart disease, diabetes, and high blood pressure? Blood pressure and heart disease High blood pressure causes heart disease and increases the risk of stroke. This is more likely to develop in people who have high blood pressure readings or are overweight. Have your blood pressure checked: Every 3-5 years if you are 25-70 years of age. Every year if you are 24 years old or older. Diabetes Have regular diabetes screenings. This checks your fasting blood sugar level. Have the screening done: Once every three years after age 62 if you are at a normal weight and have a low risk for diabetes. More often and at a younger age if you are overweight or have a high risk for diabetes. What should I know about preventing infection? Hepatitis B If you have a higher risk for hepatitis B, you should be screened for this virus. Talk with your health care provider to find out if you are at risk for hepatitis B infection. Hepatitis C Testing is recommended for: Everyone born from [birth year] through 1965. Anyone with known risk factors for hepatitis C. Sexually transmitted infections (STIs) Get screened for STIs, including gonorrhea and chlamydia, if: You are sexually active and are younger than 70 years of age. You are older than 70 years of age and your health care provider tells you that you are at risk for this type of infection. Your sexual activity has changed since you were last screened, and you are at increased risk for chlamydia or gonorrhea. Ask your health care provider if  you are at risk. Ask your health care provider about whether you are at high risk for HIV. Your health care provider may recommend a prescription medicine to help prevent HIV infection. If you choose to take medicine to prevent HIV, you should first get tested for HIV. You should then be tested every 3 months for as long as you  are taking the medicine. Osteoporosis and menopause Osteoporosis is a disease in which the bones lose minerals and strength with aging. This can result in bone fractures. If you are 70 years old or older, or if you are at risk for osteoporosis and fractures, ask your health care provider if you should: Be screened for bone loss. Take a calcium  or vitamin D supplement to lower your risk of fractures. Be given hormone replacement therapy (HRT) to treat symptoms of menopause. Follow these instructions at home: Alcohol use Do not drink alcohol if: Your health care provider tells you not to drink. You are pregnant, may be pregnant, or are planning to become pregnant. If you drink alcohol: Limit how much you have to: 0-1 drink a day. Know how much alcohol is in your drink. In the [place] one drink equals one 12 oz bottle of beer (355 mL), one 5 oz glass of wine (148 mL), or one 1 oz glass of hard liquor (44 mL). Lifestyle Do not use any products that contain nicotine or tobacco. These products include cigarettes, chewing tobacco, and vaping devices, such as e-cigarettes. If you need help quitting, ask your health care provider. Do not use street drugs. Do not share needles. Ask your health care provider for help if you need support or information about quitting drugs. General instructions Schedule regular health, dental, and eye exams. Stay current with your vaccines. Tell your health care provider if: You often feel depressed. You have ever been abused or do not feel safe at home. Summary Adopting a healthy lifestyle and getting preventive care are important in promoting health and wellness. Follow your health care provider's instructions about healthy diet, exercising, and getting tested or screened for diseases. Follow your health care provider's instructions on monitoring your cholesterol and blood pressure. This information is not intended to replace advice given to you by your health  care provider. Make sure you discuss any questions you have with your health care provider. Document Revised: 03/09/2021 Document Reviewed: 03/09/2021 Elsevier Patient Education  2024 [employer].

## 2024-12-06 NOTE — Assessment & Plan Note (Signed)
 Bone density report discussed.  Risk factors assessed: Menopause Counseled regarding osteoporosis, risk factors, treatment options. Discussed importance of weight bearing exercise, daily calcium 1200 mg daily and vit D, and fall risk reduction. Will complete work-up for secondary osteoporosis.  Patient does understand that osteoporosis is a silent disease until fracture occurs and understands that there is significant morbidity associated with osteoporotic fracture.   Recommend updated imaging and f/u to discuss treatment. Consider prolia vs evenity.

## 2024-12-06 NOTE — Patient Instructions (Addendum)
 GERD Recommend GERD diet  Continue omeprazole  20 mg po daily , take 1 capsule 30-45 mins before breakfast  Internal hemorrhoids  Recommend high fiber  Drink plenty of fluids Sitz baths as needed No pushing or straining OTC prep H prn  _______________________________________________________  If your blood pressure at your visit was 140/90 or greater, please contact your primary care physician to follow up on this.  _______________________________________________________  If you are age 9 or older, your body mass index should be between 23-30. Your Body mass index is 28.94 kg/m. If this is out of the aforementioned range listed, please consider follow up with your Primary Care Provider.  If you are age 69 or younger, your body mass index should be between 19-25. Your Body mass index is 28.94 kg/m. If this is out of the aformentioned range listed, please consider follow up with your Primary Care Provider.   ________________________________________________________  The [facility] providers would like to encourage you to use MYCHART to communicate with providers for non-urgent requests or questions.  Due to long hold times on the telephone, sending your provider a message by [facility] may be a faster and more efficient way to get a response.  Please allow 48 business hours for a response.  Please remember that this is for non-urgent requests.  _______________________________________________________  [facility] is using a team-based approach to care.  Your team is made up of your doctor and two to three APPS. Our APPS (Nurse Practitioners and Physician Assistants) work with your physician to ensure care continuity for you. They are fully qualified to address your health concerns and develop a treatment plan. They communicate directly with your gastroenterologist to care for you. Seeing the Advanced Practice Practitioners on your physician's team can help you by facilitating care  more promptly, often allowing for earlier appointments, access to diagnostic testing, procedures, and other specialty referrals.   Thank you for trusting me with your gastrointestinal care. Deanna May, FNP-C

## 2024-12-06 NOTE — Assessment & Plan Note (Signed)
 Cervical cancer screening performed according to ASCCP guidelines. Encouraged annual mammogram screening Colonoscopy UTD DXA due Labs and immunizations with her primary Encouraged safe sexual practices as indicated Encouraged healthy lifestyle practices with diet and exercise For patients under 50-70yo, I recommend 1200mg  calcium daily and 600IU of vitamin D daily.

## 2024-12-06 NOTE — Progress Notes (Signed)
 "  Chief Complaint:follow-up GERD Primary GI Doctor: Dr. Charlanne  HPI:  Patient is a  69  year old female patient with past medical history of rheumatoid arthritis, osteoporosis, hypothyroidism and GERD, who presents for follow-up on GERD.  Interval History Patient last seen in GI office on 06/07/2023 by Elida, NP for EGD and colonoscopy.  Patient has history of GERD and currently taking omeprazole  20 mg po as needed. Patients symptoms worse if she overeats or eats too late at night. She has made adjustments to diet which helps.  Denies dysphagia.   Patient denies altered bowel habits, abdominal pain, or rectal bleeding. Patient reports occasional discomfort from hemorrhoids. No bleeding.   GI procedures:  08/2023 colonoscopy, recall 7 years - Two 3 to 4 mm polyps in the mid descending colon and in the cecum, removed with a cold snare. Resected and retrieved. - Moderate sigmoid diverticulosis. - Non- bleeding internal hemorrhoids.  08/2023 EGD: - LA Grade B reflux esophagitis with no bleeding. Biopsied. - Small HH. - Gastritis. Biopsied. - Normal examined duodenum. Biopsied.  Path:  1. Surgical [P], small bowel bx :      DUODENAL MUCOSA WITH FOVEOLAR METAPLASIA CONSISTENT WITH CHRONIC PEPTIC      DUODENITIS.      NEGATIVE FOR DYSPLASIA.       2. Surgical [P], gastric antrum and gastric body :      GASTRIC ANTRAL/OXYNTIC MUCOSA WITH NO SIGNIFICANT DIAGNOSTIC ALTERATION.      NO H.PYLORI IDENTIFIED ON H&E STAIN.      NEGATIVE FOR INTESTINAL METAPLASIA OR DYSPLASIA.       3. Surgical [P], distal esophagus :      SQUAMOCOLUMNAR MUCOSA WITHOUT SIGNIFICANT DIAGNOSTIC ALTERATION.      NEGATIVE FOR INTESTINAL METAPLASIA OR DYSPLASIA.       4. Surgical [P], colon, cecum, polyp (1) :      TUBULAR ADENOMA.      NEGATIVE FOR HIGH-GRADE DYSPLASIA.       5. Surgical [P], colon, descending, polyp (1) :      TUBULAR ADENOMA.      NEGATIVE FOR HIGH-GRADE DYSPLASIA.   She underwent an  EGD and colonoscopy by Dr. Kristie 12/19/2006 which she reported were normal.    Wt Readings from Last 3 Encounters:  09/18/24 191 lb (86.6 kg)  08/15/23 200 lb (90.7 kg)  06/07/23 200 lb (90.7 kg)   Social History: She smoked cigarettes 1/2 ppd from the age 55 to 39. She infrequently drinks one glass of wine or one beer. No drug use.    Family History: family history includes Atrial fibrillation in her mother; Colon cancer in her maternal grandmother; Diabetes in her brother and maternal grandfather; Esophageal cancer (age of onset: 55) in her father; Pancreatic cancer in her maternal aunt; Prostate cancer in her father; Transient ischemic attack in her mother.    Past Medical History:  Diagnosis Date   Arthritis    GERD (gastroesophageal reflux disease)     a little   Goiter    on meds   HSV-1 infection    Hypothyroidism    Osteoporosis    not on meds at this time (03/23/2023)   PONV (postoperative nausea and vomiting)    Rheumatoid arthritis (HCC)    on meds   Simple endometrial hyperplasia without atypia 09/2007   recommended f/u bx in one year/Biopsy in 12/2008 was benign.    Past Surgical History:  Procedure Laterality Date   APPENDECTOMY  1970  BREAST BIOPSY Right 1972   Lump removed-benign   DILATION AND CURETTAGE OF UTERUS     HYSTEROSCOPY  09/2001   with D&C, Polyp   THYROIDECTOMY  2006   benign   TMJ ARTHROPLASTY  1994   TOTAL KNEE ARTHROPLASTY Right 02/23/2021   Procedure: TOTAL KNEE ARTHROPLASTY;  Surgeon: Melodi Lerner, MD;  Location: [facility];  Service: Orthopedics;  Laterality: Right;   WISDOM TOOTH EXTRACTION      Current Outpatient Medications  Medication Sig Dispense Refill   amoxicillin (AMOXIL) 500 MG tablet SMARTSIG:4 Tablet(s) By Mouth     hydroxychloroquine (PLAQUENIL) 200 MG tablet Take 400 mg by mouth daily.     ibuprofen (ADVIL) 800 MG tablet Take 800 mg by mouth every 6 (six) hours as needed for mild pain, headache, fever, moderate pain or  cramping.     levothyroxine  (SYNTHROID ) 125 MCG tablet Take 1 tablet by mouth daily.     meloxicam (MOBIC) 15 MG tablet Take 15 mg by mouth daily.     omeprazole  (PRILOSEC) 20 MG capsule TAKE 1 CAPSULE BY MOUTH EVERY DAY 90 capsule 0   pseudoephedrine (SUDAFED) 30 MG tablet Take 30 mg by mouth as needed for congestion. Take as needed     valACYclovir  (VALTREX ) 1000 MG tablet Take 2 tablets po q 12 hours x 2 (Patient taking differently: Take 2 tablets po q 12 hours x 2  , take as needed for cold sores) 30 tablet 1   No current facility-administered medications for this visit.    Allergies as of 12/06/2024   (No Known Allergies)    Family History  Problem Relation Age of Onset   Atrial fibrillation Mother    Transient ischemic attack Mother    Prostate cancer Father    Esophageal cancer Father 51       smoker until age 2   Diabetes Brother    Colon cancer Maternal Grandmother        dx age 48   Diabetes Maternal Grandfather    Pancreatic cancer Maternal Aunt    Colon polyps Neg Hx    Crohn's disease Neg Hx    Rectal cancer Neg Hx    Ulcerative colitis Neg Hx    Stomach cancer Neg Hx     Review of Systems:    Constitutional: No weight loss, fever, chills, weakness or fatigue HEENT: Eyes: No change in vision               Ears, Nose, Throat:  No change in hearing or congestion Skin: No rash or itching Cardiovascular: No chest pain, chest pressure or palpitations   Respiratory: No SOB or cough Gastrointestinal: See HPI and otherwise negative Genitourinary: No dysuria or change in urinary frequency Neurological: No headache, dizziness or syncope Musculoskeletal: No new muscle or joint pain Hematologic: No bleeding or bruising Psychiatric: No history of depression or anxiety    Physical Exam:  Vital signs: LMP 09/01/2010   Constitutional:   Pleasant female appears to be in NAD, Well developed, Well nourished, alert and cooperative Eyes:   PEERL, EOMI. No icterus.  Conjunctiva pink. Neck:  Supple Throat: Oral cavity and pharynx without inflammation, swelling or lesion.  Respiratory: Respirations even and unlabored. Lungs clear to auscultation bilaterally.   No wheezes, crackles, or rhonchi.  Cardiovascular: Normal S1, S2. Regular rate and rhythm. No peripheral edema, cyanosis or pallor.  Gastrointestinal:  Soft, nondistended, nontender. No rebound or guarding. Normal bowel sounds. No appreciable masses or hepatomegaly. Rectal:  Not performed. Declined  Msk:  Symmetrical without gross deformities. Without edema, no deformity or joint abnormality.  Neurologic:  Alert and  oriented x4;  grossly normal neurologically.  Skin:   Dry and intact without significant lesions or rashes.  RELEVANT LABS AND IMAGING: CBC    Latest Ref Rng & Units 02/24/2021    3:22 AM 02/17/2021    9:42 AM 04/28/2016   12:22 PM  CBC  WBC 4.0 - 10.5 K/uL 10.9  6.4  5.4   Hemoglobin 12.0 - 15.0 g/dL 87.5  84.2  84.6   Hematocrit 36.0 - 46.0 % 38.1  47.9  45.1   Platelets 150 - 400 K/uL 280  296  220      CMP     Latest Ref Rng & Units 02/24/2021    3:22 AM 02/23/2021    2:53 PM 04/28/2016   12:22 PM  CMP  Glucose 70 - 99 mg/dL 847  97  79   BUN 8 - 23 mg/dL 17  16  10    Creatinine 0.44 - 1.00 mg/dL 9.34  9.17  9.20   Sodium 135 - 145 mmol/L 135  142  139   Potassium 3.5 - 5.1 mmol/L 4.2  4.7  4.6   Chloride 98 - 111 mmol/L 103  107  103   CO2 22 - 32 mmol/L 26  27  24    Calcium 8.9 - 10.3 mg/dL 8.7  9.3  9.2   Total Protein 6.5 - 8.1 g/dL  6.6  6.5   Total Bilirubin 0.3 - 1.2 mg/dL  0.3  0.4   Alkaline Phos 38 - 126 U/L  79  76   AST 15 - 41 U/L  17  14   ALT 0 - 44 U/L  21  11      Assessment: Encounter Diagnoses  Name Primary?   Gastroesophageal reflux disease without esophagitis Yes   Internal hemorrhoids   68 year old female patient for presents for follow-up on GERD and internal hemorrhoids.  Plan: 1. Recommend GERD diet  2. Continue omeprazole  20 mg po  daily , refilled 3. Recommend high fiber  4. Drink plenty of fluids 5. Sitz baths as needed 6. No pushing or straining during BM's 7. Recall colonoscopy, 08/2030 8. Follow- up as needed    Thank you for the courtesy of this consult. Please call me with any questions or concerns.   Maggy Wyble, FNP-C [facility] 12/06/2024, 9:06 AM  Cc: Honora Pao, PA-C  "

## 2024-12-06 NOTE — Assessment & Plan Note (Addendum)
 Reviewed safety profile of low dose vaginal estrogen, however reviewed that higher doses have been associated with DVT, breast and uterine cancer.   Also recommend coconut oil for daily moisture and uses a lubricant

## 2024-12-07 LAB — PTH, INTACT AND CALCIUM
Calcium: 9.3 mg/dL (ref 8.6–10.4)
PTH: 18 pg/mL (ref 16–77)

## 2024-12-07 LAB — PHOSPHORUS: Phosphorus: 3.9 mg/dL (ref 2.1–4.3)

## 2024-12-07 LAB — HIV ANTIBODY (ROUTINE TESTING W REFLEX)
HIV 1&2 Ab, 4th Generation: NONREACTIVE
HIV FINAL INTERPRETATION: NEGATIVE

## 2024-12-07 LAB — VITAMIN D 25 HYDROXY (VIT D DEFICIENCY, FRACTURES): Vit D, 25-Hydroxy: 26 ng/mL — ABNORMAL LOW (ref 30–100)

## 2024-12-07 LAB — MAGNESIUM: Magnesium: 1.9 mg/dL (ref 1.5–2.5)

## 2024-12-07 LAB — SYPHILIS: RPR W/REFLEX TO RPR TITER AND TREPONEMAL ANTIBODIES, TRADITIONAL SCREENING AND DIAGNOSIS ALGORITHM: RPR Ser Ql: NONREACTIVE

## 2024-12-07 LAB — HEPATITIS C ANTIBODY: Hepatitis C Ab: NONREACTIVE

## 2024-12-07 LAB — TSH: TSH: 1.19 m[IU]/L (ref 0.40–4.50)

## 2024-12-31 ENCOUNTER — Ambulatory Visit (INDEPENDENT_AMBULATORY_CARE_PROVIDER_SITE_OTHER)
